# Patient Record
Sex: Female | Born: 1965 | Race: White | Hispanic: No | Marital: Married | Smoking: Former smoker
Health system: Southern US, Community
[De-identification: ages and names within clinical notes are randomized; demographics above are authoritative.]

## PROBLEM LIST (undated history)

## (undated) DIAGNOSIS — Z85828 Personal history of other malignant neoplasm of skin: Secondary | ICD-10-CM

## (undated) DIAGNOSIS — E279 Disorder of adrenal gland, unspecified: Secondary | ICD-10-CM

## (undated) DIAGNOSIS — E559 Vitamin D deficiency, unspecified: Secondary | ICD-10-CM

## (undated) DIAGNOSIS — E785 Hyperlipidemia, unspecified: Secondary | ICD-10-CM

## (undated) DIAGNOSIS — I2510 Atherosclerotic heart disease of native coronary artery without angina pectoris: Secondary | ICD-10-CM

## (undated) DIAGNOSIS — N840 Polyp of corpus uteri: Secondary | ICD-10-CM

## (undated) DIAGNOSIS — R7303 Prediabetes: Secondary | ICD-10-CM

## (undated) DIAGNOSIS — I10 Essential (primary) hypertension: Secondary | ICD-10-CM

## (undated) HISTORY — PX: MOHS SURGERY: SUR867

## (undated) HISTORY — PX: DILATION AND CURETTAGE OF UTERUS: SHX78

---

## 2006-04-21 ENCOUNTER — Ambulatory Visit: Payer: Self-pay

## 2011-01-02 ENCOUNTER — Ambulatory Visit: Payer: Self-pay | Admitting: Internal Medicine

## 2014-09-21 ENCOUNTER — Ambulatory Visit: Payer: Self-pay | Admitting: Internal Medicine

## 2015-09-10 ENCOUNTER — Inpatient Hospital Stay
Admission: EM | Admit: 2015-09-10 | Discharge: 2015-09-14 | DRG: 416 | Disposition: A | Discharge status: Home / Self Care | Payer: BLUE CROSS/BLUE SHIELD | Attending: Surgery | Admitting: Surgery

## 2015-09-10 ENCOUNTER — Emergency Department: Payer: BLUE CROSS/BLUE SHIELD

## 2015-09-10 ENCOUNTER — Encounter: Payer: Self-pay | Admitting: Emergency Medicine

## 2015-09-10 DIAGNOSIS — K810 Acute cholecystitis: Secondary | ICD-10-CM | POA: Diagnosis not present

## 2015-09-10 DIAGNOSIS — Z6829 Body mass index (BMI) 29.0-29.9, adult: Secondary | ICD-10-CM

## 2015-09-10 DIAGNOSIS — K8020 Calculus of gallbladder without cholecystitis without obstruction: Secondary | ICD-10-CM

## 2015-09-10 DIAGNOSIS — E669 Obesity, unspecified: Secondary | ICD-10-CM | POA: Diagnosis present

## 2015-09-10 DIAGNOSIS — K8050 Calculus of bile duct without cholangitis or cholecystitis without obstruction: Secondary | ICD-10-CM | POA: Diagnosis not present

## 2015-09-10 DIAGNOSIS — I10 Essential (primary) hypertension: Secondary | ICD-10-CM | POA: Diagnosis present

## 2015-09-10 DIAGNOSIS — K8000 Calculus of gallbladder with acute cholecystitis without obstruction: Principal | ICD-10-CM | POA: Diagnosis present

## 2015-09-10 HISTORY — DX: Essential (primary) hypertension: I10

## 2015-09-10 LAB — CBC WITH DIFFERENTIAL/PLATELET
Basophils Absolute: 0.1 10*3/uL (ref 0–0.1)
Basophils Relative: 0 %
EOS PCT: 1 %
Eosinophils Absolute: 0.2 10*3/uL (ref 0–0.7)
HEMATOCRIT: 39.6 % (ref 35.0–47.0)
Hemoglobin: 13.6 g/dL (ref 12.0–16.0)
LYMPHS ABS: 1.5 10*3/uL (ref 1.0–3.6)
LYMPHS PCT: 11 %
MCH: 32.7 pg (ref 26.0–34.0)
MCHC: 34.4 g/dL (ref 32.0–36.0)
MCV: 94.9 fL (ref 80.0–100.0)
MONO ABS: 0.7 10*3/uL (ref 0.2–0.9)
Monocytes Relative: 5 %
NEUTROS ABS: 11.3 10*3/uL — AB (ref 1.4–6.5)
Neutrophils Relative %: 83 %
PLATELETS: 270 10*3/uL (ref 150–440)
RBC: 4.18 MIL/uL (ref 3.80–5.20)
RDW: 13.1 % (ref 11.5–14.5)
WBC: 13.9 10*3/uL — ABNORMAL HIGH (ref 3.6–11.0)

## 2015-09-10 LAB — URINALYSIS COMPLETE WITH MICROSCOPIC (ARMC ONLY)
BILIRUBIN URINE: NEGATIVE
GLUCOSE, UA: NEGATIVE mg/dL
Hgb urine dipstick: NEGATIVE
KETONES UR: NEGATIVE mg/dL
Nitrite: NEGATIVE
Protein, ur: NEGATIVE mg/dL
Specific Gravity, Urine: 1.025 (ref 1.005–1.030)
pH: 5 (ref 5.0–8.0)

## 2015-09-10 LAB — COMPREHENSIVE METABOLIC PANEL
ALT: 24 U/L (ref 14–54)
AST: 19 U/L (ref 15–41)
Albumin: 3.8 g/dL (ref 3.5–5.0)
Alkaline Phosphatase: 64 U/L (ref 38–126)
Anion gap: 5 (ref 5–15)
BILIRUBIN TOTAL: 0.8 mg/dL (ref 0.3–1.2)
BUN: 19 mg/dL (ref 6–20)
CHLORIDE: 106 mmol/L (ref 101–111)
CO2: 30 mmol/L (ref 22–32)
CREATININE: 0.73 mg/dL (ref 0.44–1.00)
Calcium: 9.2 mg/dL (ref 8.9–10.3)
Glucose, Bld: 135 mg/dL — ABNORMAL HIGH (ref 65–99)
Potassium: 3.3 mmol/L — ABNORMAL LOW (ref 3.5–5.1)
Sodium: 141 mmol/L (ref 135–145)
TOTAL PROTEIN: 7 g/dL (ref 6.5–8.1)

## 2015-09-10 LAB — LIPASE, BLOOD: LIPASE: 25 U/L (ref 22–51)

## 2015-09-10 MED ORDER — IOHEXOL 240 MG/ML SOLN
25.0000 mL | Freq: Once | INTRAMUSCULAR | Status: AC | PRN
  Administered 2015-09-10: 25 mL via ORAL
  Filled 2015-09-10: qty 25

## 2015-09-10 MED ORDER — KCL IN DEXTROSE-NACL 20-5-0.45 MEQ/L-%-% IV SOLN
INTRAVENOUS | Status: DC
  Administered 2015-09-10 – 2015-09-13 (×6): via INTRAVENOUS
  Filled 2015-09-10 (×11): qty 1000

## 2015-09-10 MED ORDER — HYDROMORPHONE HCL 1 MG/ML IJ SOLN
1.0000 mg | INTRAMUSCULAR | Status: DC | PRN
  Administered 2015-09-10 – 2015-09-11 (×4): 1 mg via INTRAVENOUS
  Filled 2015-09-10 (×4): qty 1

## 2015-09-10 MED ORDER — MORPHINE SULFATE (PF) 4 MG/ML IV SOLN
4.0000 mg | Freq: Once | INTRAVENOUS | Status: AC
  Administered 2015-09-10: 4 mg via INTRAVENOUS
  Filled 2015-09-10: qty 1

## 2015-09-10 MED ORDER — HYDROMORPHONE HCL 1 MG/ML IJ SOLN
1.0000 mg | Freq: Once | INTRAMUSCULAR | Status: AC
Start: 2015-09-10 — End: 2015-09-10
  Administered 2015-09-10: 1 mg via INTRAVENOUS
  Filled 2015-09-10: qty 1

## 2015-09-10 MED ORDER — HYDROMORPHONE HCL 1 MG/ML IJ SOLN
INTRAMUSCULAR | Status: AC
  Administered 2015-09-10: 1 mg via INTRAVENOUS
  Filled 2015-09-10: qty 1

## 2015-09-10 MED ORDER — ACETAMINOPHEN 650 MG RE SUPP: 650.0000 mg | Freq: Four times a day (QID) | RECTAL | Status: DC | PRN

## 2015-09-10 MED ORDER — ACETAMINOPHEN 325 MG PO TABS
650.0000 mg | ORAL_TABLET | Freq: Four times a day (QID) | ORAL | Status: DC | PRN
  Administered 2015-09-11: 650 mg via ORAL
  Filled 2015-09-10: qty 2

## 2015-09-10 MED ORDER — ONDANSETRON HCL 4 MG/2ML IJ SOLN
4.0000 mg | Freq: Four times a day (QID) | INTRAMUSCULAR | Status: DC | PRN
  Administered 2015-09-11: 4 mg via INTRAVENOUS

## 2015-09-10 MED ORDER — ONDANSETRON HCL 4 MG PO TABS: 4.0000 mg | ORAL_TABLET | Freq: Four times a day (QID) | ORAL | Status: DC | PRN

## 2015-09-10 MED ORDER — AMPICILLIN-SULBACTAM SODIUM 3 (2-1) G IJ SOLR
3.0000 g | Freq: Four times a day (QID) | INTRAMUSCULAR | Status: DC
  Administered 2015-09-10 – 2015-09-13 (×11): 3 g via INTRAVENOUS
  Filled 2015-09-10 (×13): qty 3

## 2015-09-10 MED ORDER — HYDROMORPHONE HCL 1 MG/ML IJ SOLN
INTRAMUSCULAR | Status: AC
  Filled 2015-09-10: qty 1

## 2015-09-10 MED ORDER — HYDROMORPHONE HCL 1 MG/ML IJ SOLN
1.0000 mg | Freq: Once | INTRAMUSCULAR | Status: AC
  Administered 2015-09-10: 1 mg via INTRAVENOUS

## 2015-09-10 MED ORDER — ENOXAPARIN SODIUM 40 MG/0.4ML ~~LOC~~ SOLN
40.0000 mg | SUBCUTANEOUS | Status: DC
  Administered 2015-09-10 – 2015-09-13 (×4): 40 mg via SUBCUTANEOUS
  Filled 2015-09-10 (×4): qty 0.4

## 2015-09-10 MED ORDER — IOHEXOL 300 MG/ML  SOLN
100.0000 mL | Freq: Once | INTRAMUSCULAR | Status: AC | PRN
  Administered 2015-09-10: 100 mL via INTRAVENOUS
  Filled 2015-09-10: qty 100

## 2015-09-10 NOTE — ED Provider Notes (Signed)
[facility] Emergency Department Provider Note  Time seen: 2:29 PM  I have reviewed the triage vital signs and the nursing notes.   HISTORY  Chief Complaint Abdominal Pain    HPI Julia Hartman is a 49 y.o. female with a past medical history of hypertension who presents with upper abdominal pain for the past 3 days. According to the patient she has had progressively worsening upper abdominal pain. Now with nausea and one episode of vomiting. States it is an 8 out of 10 currently. Denies diarrhea, dysuria, hematuria. Denies any worsening with food but notes she has not had an appetite due to pain. Denies any history of abdominal surgeries. Denies any fever. States the pain is a dull/aching pain, unrelenting.     Past Medical History  Diagnosis Date  . Hypertension     There are no active problems to display for this patient.   History reviewed. No pertinent past surgical history.  No current outpatient prescriptions on file.  Allergies Review of patient's allergies indicates no known allergies.  History reviewed. No pertinent family history.  Social History Social History  Substance Use Topics  . Smoking status: Never Smoker   . Smokeless tobacco: None  . Alcohol Use: Yes    Review of Systems Constitutional: Negative for fever. Cardiovascular: Negative for chest pain. Respiratory: Negative for shortness of breath. Gastrointestinal:  positive for upper abdominal pain, nausea, one episode of vomiting. No diarrhea.: Negative for dysuria. Musculoskeletal: Negative for back pain. Neurological: Negative for headache 10-point ROS otherwise negative.  ____________________________________________   PHYSICAL EXAM:  VITAL SIGNS: ED Triage Vitals  Enc Vitals Group     BP 09/10/15 1313 141/80 mmHg     Pulse Rate 09/10/15 1313 67     Resp 09/10/15 1404 18     Temp 09/10/15 1313 98.7 F (37.1 C)     Temp Source 09/10/15 1313 Oral     SpO2  09/10/15 1313 98 %     Weight 09/10/15 1313 175 lb (79.379 kg)     Height 09/10/15 1313  (1.651 m)     Head Cir --      Peak Flow --      Pain Score 09/10/15 1314 8     Pain Loc --      Pain Edu? --      Excl. in GC? --     Constitutional: Alert and oriented.  mild distress due to pain.  Eyes: Normal exam ENT   Head: Normocephalic and atraumatic.   Mouth/Throat: Mucous membranes are moist. Cardiovascular: Normal rate, regular rhythm. No murmur Respiratory: Normal respiratory effort without tachypnea nor retractions. Breath sounds are clear and equal bilaterally. No wheezes/rales/rhonchi. Gastrointestinalsoft, moderate upper abdominal tenderness to palpation, appears to be more midline. No rebound or guarding. No CVA tenderness to palpation. No distention noted. Musculoskeletal: Nontender with normal range of motion in all extremities.  Neurologic:  Normal speech and language. No gross focal neurologic deficits Psychiatric: Mood and affect are normal. Speech and behavior are normal. Patient exhibits appropriate insight and judgment.  ____________________________________________    EKG  EKG reviewed and interpreted by myself shows normal sinus rhythm at 63 bpm, narrow QRS, normal axis, normal intervals, no ST changes noted. Overall normal EKG.  ____________________________________________    RADIOLOGY  CT shows multiple gallstones with possible wall thickening Ultrasound shows multiple gallstones but no signs of acute cholecystitis.  ____________________________________________   INITIAL IMPRESSION / ASSESSMENT AND PLAN / ED COURSE  Pertinent labs & imaging results that were available during my care of the patient were reviewed by me and considered in my medical decision making (see chart for details).  Patient has a mild leukocytosis, labs otherwise within normal limits. Epigastric tenderness palpation on exam. Mild distress.  we will proceed with a CT abdomen  and pelvis to further evaluate. Patient denies any alcohol use. She does note almost daily NSAID use. Differential would include gastritis, but we will proceed with a CT to rule out more concerning pathologies.   Given the location of her pain in the right upper quadrant/epigastrium, she has required multiple rounds of pain medication, elevated white blood cell count, and ultrasound findings I have consult to general surgery for evaluation.  Dr. Egbert Garibaldi seen the patient and will be admitted to his service.   ____________________________________________   FINAL CLINICAL IMPRESSION(S) / ED DIAGNOSES  epigastric pain  Minna Antis, MD 09/10/15 [id number]

## 2015-09-10 NOTE — H&P (Signed)
Julia Hartman is an 49 y.o. female.     Chief Complaint: upper abdominal pain and emesis HPI:  49 year old female with a family history of cholelithasis who presents to the emergency room with a 2 day history of colicky severe epigastric abdominal pain without radiation into her right upper quadrant or mid back. This began after a meal chili. The patient denies any previous history of fatty food intolerance or postprandial right upper quadrant abdominal pain. Of note her brother recently had his gallbladder out as well as her mother. The patient denies any fevers but has had some chills. There is been no reported jaundice. She drinks wine occasionally. Workup in the emergency room in suggestive of "acute cholecystitis and possible choledocholithiasis. Surgical services were asked to evaluate. The patient had a previous D&C.  Past Medical History  Diagnosis Date  . Hypertension     Surgical history significant for D&C  Family history significant for cholelithiasis.   Social History:  reports that she has never smoked. She does not have any smokeless tobacco history on file. She reports that she drinks alcohol. Her drug history is not on file.  she drinks a glass of wine occasionally. She is self-employed as a Investment banker, operational.  Allergies: No Known Allergies  Review of Systems  Constitutional: Positive for chills, weight loss, malaise/fatigue and diaphoresis. Negative for fever.  HENT: Negative.   Eyes: Negative.   Respiratory: Negative for cough.   Cardiovascular: Negative for chest pain and palpitations.  Gastrointestinal: Positive for nausea, vomiting and abdominal pain. Negative for diarrhea, constipation, blood in stool and melena.  Genitourinary: Negative for dysuria and urgency.  Musculoskeletal: Negative.   Skin: Negative.   Neurological: Positive for weakness.  Endo/Heme/Allergies: Negative.      Physical Exam  Constitutional: She is oriented to person,  place, and time and well-developed, well-nourished, and in no distress. No distress.  HENT:  Head: Normocephalic and atraumatic.  Eyes: Pupils are equal, round, and reactive to light.  Neck: Normal range of motion. No tracheal deviation present. No thyromegaly present.  Cardiovascular: Normal rate, regular rhythm and normal heart sounds.  Exam reveals no gallop.   No murmur heard. Pulmonary/Chest: Breath sounds normal. No stridor. No respiratory distress. She has no wheezes.  Abdominal: Soft. Bowel sounds are normal. She exhibits no distension and no mass. There is no hepatomegaly. There is tenderness in the epigastric area. There is no rebound and no guarding. No hernia.    Neurological: She is oriented to person, place, and time.  Skin: Skin is dry. She is not diaphoretic.  Psychiatric: Mood, memory, affect and judgment normal.    Blood pressure 180/88, pulse 64, temperature 98.7 F (37.1 C), temperature source Oral, resp. rate 18, height _0  (1.651 m), weight 175 lb (79.379 kg), SpO2 100 %.  Results for orders placed or performed during the hospital encounter of 09/10/15 (from the past 48 hour(s))  CBC WITH DIFFERENTIAL     Status: Abnormal   Collection Time: 09/10/15  1:26 PM  Result Value Ref Range   WBC 13.9 (H) 3.6 - 11.0 K/uL   RBC 4.18 3.80 - 5.20 MIL/uL   Hemoglobin 13.6 12.0 - 16.0 g/dL   HCT 39.6 35.0 - 47.0 %   MCV 94.9 80.0 - 100.0 fL   MCH 32.7 26.0 - 34.0 pg   MCHC 34.4 32.0 - 36.0 g/dL   RDW 13.1 11.5 - 14.5 %   Platelets 270 150 - 440 K/uL  Neutrophils Relative % 83 %   Neutro Abs 11.3 (H) 1.4 - 6.5 K/uL   Lymphocytes Relative 11 %   Lymphs Abs 1.5 1.0 - 3.6 K/uL   Monocytes Relative 5 %   Monocytes Absolute 0.7 0.2 - 0.9 K/uL   Eosinophils Relative 1 %   Eosinophils Absolute 0.2 0 - 0.7 K/uL   Basophils Relative 0 %   Basophils Absolute 0.1 0 - 0.1 K/uL  Comprehensive metabolic panel     Status: Abnormal   Collection Time: 09/10/15  1:26 PM  Result  Value Ref Range   Sodium 141 135 - 145 mmol/L   Potassium 3.3 (L) 3.5 - 5.1 mmol/L   Chloride 106 101 - 111 mmol/L   CO2 30 22 - 32 mmol/L   Glucose, Bld 135 (H) 65 - 99 mg/dL   BUN 19 6 - 20 mg/dL   Creatinine, Ser 0.73 0.44 - 1.00 mg/dL   Calcium 9.2 8.9 - 10.3 mg/dL   Total Protein 7.0 6.5 - 8.1 g/dL   Albumin 3.8 3.5 - 5.0 g/dL   AST 19 15 - 41 U/L   ALT 24 14 - 54 U/L   Alkaline Phosphatase 64 38 - 126 U/L   Total Bilirubin 0.8 0.3 - 1.2 mg/dL   GFR calc non Af Amer >60 >60 mL/min   GFR calc Af Amer >60 >60 mL/min    Comment: (NOTE) The eGFR has been calculated using the CKD EPI equation. This calculation has not been validated in all clinical situations. eGFR's persistently <60 mL/min signify possible Chronic Kidney Disease.    Anion gap 5 5 - 15  Lipase, blood     Status: None   Collection Time: 09/10/15  1:26 PM  Result Value Ref Range   Lipase 25 22 - 51 U/L  Urinalysis complete, with microscopic ([facility] only)     Status: Abnormal   Collection Time: 09/10/15  1:40 PM  Result Value Ref Range   Color, Urine YELLOW (A) YELLOW   APPearance CLEAR (A) CLEAR   Glucose, UA NEGATIVE NEGATIVE mg/dL   Bilirubin Urine NEGATIVE NEGATIVE   Ketones, ur NEGATIVE NEGATIVE mg/dL   Specific Gravity, Urine 1.025 1.005 - 1.030   Hgb urine dipstick NEGATIVE NEGATIVE   pH 5.0 5.0 - 8.0   Protein, ur NEGATIVE NEGATIVE mg/dL   Nitrite NEGATIVE NEGATIVE   Leukocytes, UA TRACE (A) NEGATIVE   RBC / HPF 0-5 0 - 5 RBC/hpf   WBC, UA 0-5 0 - 5 WBC/hpf   Bacteria, UA RARE (A) NONE SEEN   Squamous Epithelial / LPF 6-30 (A) NONE SEEN   Mucous PRESENT    Ct Abdomen Pelvis W Contrast  09/10/2015   CLINICAL DATA:  Epigastric pain, vomiting starting Saturday night  EXAM: CT ABDOMEN AND PELVIS WITH CONTRAST  TECHNIQUE: Multidetector CT imaging of the abdomen and pelvis was performed using the standard protocol following bolus administration of intravenous contrast.  CONTRAST:  172m OMNIPAQUE  IOHEXOL 300 MG/ML  SOLN  COMPARISON:  None.  FINDINGS: Lung bases are unremarkable. Enhanced liver shows no focal mass. Sagittal images of the spine shows mild degenerative changes thoracolumbar spine. Mild dextroscoliosis lumbar spine. Mild distended gallbladder. Small calcified gallstones are noted in gallbladder fundus the largest measures 5 mm. There is mild thickening of the gallbladder wall in proximal gallbladder. This is best visualized in coronal image 72. Findings are highly suspicious for acute cholecystitis. Clinical correlation is necessary. CBD measures 8 mm in diameter. Main pancreatic duct  measures 3 mm in diameter. No pancreatic mass. No evidence of acute pancreatitis. The spleen and adrenal glands are unremarkable. Kidneys are symmetrical in size and enhancement. No hydronephrosis or hydroureter.  No small bowel obstruction. No ascites or free air. No adenopathy. Normal appendix. No pericecal inflammation. The terminal ileum is unremarkable. The uterus is retroflexed. The ovaries are unremarkable. Small amount of pelvic free fluid posterior pelvis. No inguinal adenopathy. Mild degenerative changes pubic symphysis. No destructive bony lesions are noted within pelvis. Mild degenerative changes SI joints.  There is no evidence of gastric outlet obstruction.  IMPRESSION: 1. Small gallstones are noted within gallbladder. Mild thickening of gallbladder wall and proximal gallbladder please see 72. Cholecystitis cannot be excluded. Further evaluation some with gallbladder ultrasound and clinical correlation is recommended. 2. Borderline prominent main pancreatic duct without evidence of acute pancreatitis or pancreatic mass. 3. No hydronephrosis or hydroureter. 4. Normal appendix.  No pericecal inflammation. 5. Retroflexed uterus.  Small amount of free fluid within pelvis. These results were called by telephone at the time of interpretation on 09/10/2015 at 3:33 pm to Dr. Harvest Dark , who verbally  acknowledged these results.   Electronically Signed   By: Lahoma Crocker M.D.   On: 09/10/2015 15:33   US Abdomen Limited Ruq  09/10/2015   CLINICAL DATA:  Right upper quadrant pain for 2 days.  EXAM: US ABDOMEN LIMITED - RIGHT UPPER QUADRANT  COMPARISON:  Current abdomen and pelvis CT  FINDINGS: Gallbladder:  Multiple dependent gallstones. There is no gallbladder wall thickening or pericholecystic fluid.  Common bile duct:  Diameter: 5.7 mm proximally and 7 mm in its midportion. No sonographic evidence of a duct stone. No evidence of a duct stone  Liver:  No focal lesion identified. Within normal limits in parenchymal echogenicity.  IMPRESSION: 1. Multiple gallstones.  No evidence of acute cholecystitis. 2. Mild dilation of the common bile duct without ultrasound evidence of a duct stone. A distal duct stone, however, is not excluded, since this portion of the duct was not fully visualized.   Electronically Signed   By: Lajean Manes M.D.   On: 09/10/2015 17:07     Assessment/Plan   This is a 49 year old white female with what looks like acute cholecystitis with leukocytosis. Central location of her pain in the epigastrium as well as a dilated bile duct is also suggestive of possible choledocholithiasis. I will admit her to the hospital hydrated her and provide dilated IV pain medication as well as Zofran.    I will repeat her liver function tests in the morning. In the side and whether or not MRCP will be done preoperatively. I discussed with her and her mother-in-law present laparoscopic cholecystectomy including cholangiography the 1 and 200 risk of bile duct injury and the need for an open operation and the possibility of needing an postoperative ERCP based on the intraoperative findings. All of her questions were answered and she wishes to proceed with this course of action.  Hortencia Conradi, MD, FACS

## 2015-09-10 NOTE — ED Notes (Signed)
Pt up to the BR with 1 assist.the patient states pain has improved. Skin is warm and dry, respirations WNL.Marland Kitchen

## 2015-09-10 NOTE — ED Notes (Signed)
Pt finished drinking contrast, CT notified 

## 2015-09-10 NOTE — ED Notes (Signed)
Pt with epigastric pain that started sat nite with emesis and grew worse. 8/10 now. Pt tearful. Denies diarrhea.

## 2015-09-11 ENCOUNTER — Observation Stay: Payer: BLUE CROSS/BLUE SHIELD | Admitting: Anesthesiology

## 2015-09-11 ENCOUNTER — Observation Stay: Payer: BLUE CROSS/BLUE SHIELD

## 2015-09-11 ENCOUNTER — Encounter
Admission: EM | Disposition: A | Discharge status: Home / Self Care | Payer: Self-pay | Source: Home / Self Care | Attending: Surgery

## 2015-09-11 ENCOUNTER — Encounter: Payer: Self-pay | Admitting: Anesthesiology

## 2015-09-11 DIAGNOSIS — K8020 Calculus of gallbladder without cholecystitis without obstruction: Secondary | ICD-10-CM | POA: Insufficient documentation

## 2015-09-11 DIAGNOSIS — K810 Acute cholecystitis: Secondary | ICD-10-CM

## 2015-09-11 DIAGNOSIS — K8050 Calculus of bile duct without cholangitis or cholecystitis without obstruction: Secondary | ICD-10-CM | POA: Diagnosis not present

## 2015-09-11 HISTORY — PX: CHOLECYSTECTOMY: SHX55

## 2015-09-11 LAB — COMPREHENSIVE METABOLIC PANEL
ALT: 35 U/L (ref 14–54)
ANION GAP: 10 (ref 5–15)
AST: 24 U/L (ref 15–41)
Albumin: 3.2 g/dL — ABNORMAL LOW (ref 3.5–5.0)
Alkaline Phosphatase: 56 U/L (ref 38–126)
BUN: 15 mg/dL (ref 6–20)
CHLORIDE: 101 mmol/L (ref 101–111)
CO2: 27 mmol/L (ref 22–32)
CREATININE: 0.66 mg/dL (ref 0.44–1.00)
Calcium: 8.3 mg/dL — ABNORMAL LOW (ref 8.9–10.3)
Glucose, Bld: 144 mg/dL — ABNORMAL HIGH (ref 65–99)
POTASSIUM: 3.3 mmol/L — AB (ref 3.5–5.1)
SODIUM: 138 mmol/L (ref 135–145)
Total Bilirubin: 0.9 mg/dL (ref 0.3–1.2)
Total Protein: 5.9 g/dL — ABNORMAL LOW (ref 6.5–8.1)

## 2015-09-11 LAB — SURGICAL PCR SCREEN
MRSA, PCR: NEGATIVE
STAPHYLOCOCCUS AUREUS: NEGATIVE

## 2015-09-11 SURGERY — LAPAROSCOPIC CHOLECYSTECTOMY
Anesthesia: General | Wound class: Clean Contaminated

## 2015-09-11 MED ORDER — SUGAMMADEX SODIUM 500 MG/5ML IV SOLN
INTRAVENOUS | Status: DC | PRN
  Administered 2015-09-11: 161.8 mg via INTRAVENOUS

## 2015-09-11 MED ORDER — MIDAZOLAM HCL 5 MG/5ML IJ SOLN
INTRAMUSCULAR | Status: DC | PRN
  Administered 2015-09-11: 2 mg via INTRAVENOUS

## 2015-09-11 MED ORDER — HYDROMORPHONE 0.3 MG/ML IV SOLN
INTRAVENOUS | Status: DC
  Administered 2015-09-11: 17:00:00 via INTRAVENOUS
  Administered 2015-09-11: 1.1 mg via INTRAVENOUS
  Administered 2015-09-12: 0.3 mg via INTRAVENOUS
  Administered 2015-09-12: 1.5 mg via INTRAVENOUS
  Administered 2015-09-12: 0.9 mg via INTRAVENOUS
  Administered 2015-09-12: 1.2 mg via INTRAVENOUS
  Administered 2015-09-12: 1.5 mg via INTRAVENOUS
  Administered 2015-09-13: 0.3 mg via INTRAVENOUS
  Administered 2015-09-13: 0.6 mg via INTRAVENOUS
  Administered 2015-09-13: 0.9 mg via INTRAVENOUS
  Filled 2015-09-11 (×2): qty 25

## 2015-09-11 MED ORDER — LIDOCAINE HCL (CARDIAC) 20 MG/ML IV SOLN
INTRAVENOUS | Status: DC | PRN
  Administered 2015-09-11: 80 mg via INTRAVENOUS

## 2015-09-11 MED ORDER — GLYCOPYRROLATE 0.2 MG/ML IJ SOLN
INTRAMUSCULAR | Status: DC | PRN
  Administered 2015-09-11: 0.2 mg via INTRAVENOUS

## 2015-09-11 MED ORDER — DEXAMETHASONE SODIUM PHOSPHATE 10 MG/ML IJ SOLN
INTRAMUSCULAR | Status: DC | PRN
  Administered 2015-09-11: 10 mg via INTRAVENOUS

## 2015-09-11 MED ORDER — SUCCINYLCHOLINE CHLORIDE 20 MG/ML IJ SOLN
INTRAMUSCULAR | Status: DC | PRN
  Administered 2015-09-11: 100 mg via INTRAVENOUS

## 2015-09-11 MED ORDER — BUPIVACAINE HCL 0.25 % IJ SOLN
INTRAMUSCULAR | Status: DC | PRN
  Administered 2015-09-11: 30 mL

## 2015-09-11 MED ORDER — KETOROLAC TROMETHAMINE 30 MG/ML IJ SOLN
INTRAMUSCULAR | Status: DC | PRN
  Administered 2015-09-11: 30 mg via INTRAVENOUS

## 2015-09-11 MED ORDER — OXYCODONE HCL 5 MG/5ML PO SOLN: 5.0000 mg | Freq: Once | ORAL | Status: AC | PRN

## 2015-09-11 MED ORDER — DIPHENHYDRAMINE HCL 12.5 MG/5ML PO ELIX: 12.5000 mg | ORAL_SOLUTION | Freq: Four times a day (QID) | ORAL | Status: DC | PRN

## 2015-09-11 MED ORDER — OXYCODONE HCL 5 MG PO TABS
ORAL_TABLET | ORAL | Status: AC
  Administered 2015-09-11: 5 mg via ORAL
  Filled 2015-09-11: qty 1

## 2015-09-11 MED ORDER — ROCURONIUM BROMIDE 100 MG/10ML IV SOLN
INTRAVENOUS | Status: DC | PRN
  Administered 2015-09-11: 10 mg via INTRAVENOUS
  Administered 2015-09-11: 40 mg via INTRAVENOUS
  Administered 2015-09-11: 20 mg via INTRAVENOUS
  Administered 2015-09-11: 10 mg via INTRAVENOUS

## 2015-09-11 MED ORDER — DIPHENHYDRAMINE HCL 50 MG/ML IJ SOLN: 12.5000 mg | Freq: Four times a day (QID) | INTRAMUSCULAR | Status: DC | PRN

## 2015-09-11 MED ORDER — OXYCODONE HCL 5 MG PO TABS
5.0000 mg | ORAL_TABLET | Freq: Once | ORAL | Status: AC | PRN
  Administered 2015-09-11: 5 mg via ORAL

## 2015-09-11 MED ORDER — THROMBIN 5000 UNITS EX SOLR
CUTANEOUS | Status: AC
  Filled 2015-09-11: qty 5000

## 2015-09-11 MED ORDER — THROMBIN 5000 UNITS EX SOLR
CUTANEOUS | Status: DC | PRN
  Administered 2015-09-11: 5000 [IU] via TOPICAL

## 2015-09-11 MED ORDER — LACTATED RINGERS IV SOLN
INTRAVENOUS | Status: DC | PRN
  Administered 2015-09-11 (×2): via INTRAVENOUS

## 2015-09-11 MED ORDER — FENTANYL CITRATE (PF) 250 MCG/5ML IJ SOLN
INTRAMUSCULAR | Status: DC | PRN
  Administered 2015-09-11 (×2): 100 ug via INTRAVENOUS
  Administered 2015-09-11: 50 ug via INTRAVENOUS

## 2015-09-11 MED ORDER — NALOXONE HCL 0.4 MG/ML IJ SOLN: 0.4000 mg | INTRAMUSCULAR | Status: DC | PRN

## 2015-09-11 MED ORDER — ACETAMINOPHEN 10 MG/ML IV SOLN
INTRAVENOUS | Status: DC | PRN
  Administered 2015-09-11: 1000 mg via INTRAVENOUS

## 2015-09-11 MED ORDER — FENTANYL CITRATE (PF) 100 MCG/2ML IJ SOLN
INTRAMUSCULAR | Status: AC
  Administered 2015-09-11: 50 ug via INTRAVENOUS
  Filled 2015-09-11: qty 2

## 2015-09-11 MED ORDER — SODIUM CHLORIDE 0.9 % IJ SOLN: 9.0000 mL | INTRAMUSCULAR | Status: DC | PRN

## 2015-09-11 MED ORDER — PROPOFOL 10 MG/ML IV BOLUS
INTRAVENOUS | Status: DC | PRN
  Administered 2015-09-11: 150 mg via INTRAVENOUS

## 2015-09-11 MED ORDER — ONDANSETRON HCL 4 MG/2ML IJ SOLN: 4.0000 mg | Freq: Four times a day (QID) | INTRAMUSCULAR | Status: DC | PRN

## 2015-09-11 MED ORDER — 0.9 % SODIUM CHLORIDE (POUR BTL) OPTIME
TOPICAL | Status: DC | PRN
  Administered 2015-09-11: 2500 mL

## 2015-09-11 MED ORDER — FENTANYL CITRATE (PF) 100 MCG/2ML IJ SOLN
25.0000 ug | INTRAMUSCULAR | Status: DC | PRN
Start: 2015-09-11 — End: 2015-09-13
  Administered 2015-09-11 (×3): 50 ug via INTRAVENOUS

## 2015-09-11 MED ORDER — BUPIVACAINE HCL (PF) 0.25 % IJ SOLN
INTRAMUSCULAR | Status: AC
  Filled 2015-09-11: qty 30

## 2015-09-11 MED ORDER — ACETAMINOPHEN 10 MG/ML IV SOLN
INTRAVENOUS | Status: AC
  Filled 2015-09-11: qty 100

## 2015-09-11 SURGICAL SUPPLY — 64 items
APPLICATOR SURGIFLO ENDO (HEMOSTASIS) ×2 IMPLANT
APPLIER CLIP 5 13 M/L LIGAMAX5 (MISCELLANEOUS) ×2
BAG COUNTER SPONGE EZ (MISCELLANEOUS) IMPLANT
BENZOIN TINCTURE PRP APPL 2/3 (GAUZE/BANDAGES/DRESSINGS) ×2 IMPLANT
BULB RESERV EVAC DRAIN JP 100C (MISCELLANEOUS) ×2 IMPLANT
CANISTER SUCT 1200ML W/VALVE (MISCELLANEOUS) ×2 IMPLANT
CATH CHOLANG 76X19 KUMAR (CATHETERS) IMPLANT
CHLORAPREP W/TINT 26ML (MISCELLANEOUS) ×2 IMPLANT
CLEANER CAUTERY TIP 5X5 PAD (MISCELLANEOUS) ×1 IMPLANT
CLIP APPLIE 5 13 M/L LIGAMAX5 (MISCELLANEOUS) ×1 IMPLANT
DEFOGGER SCOPE WARMER CLEARIFY (MISCELLANEOUS) ×2 IMPLANT
DISSECTOR KITTNER STICK (MISCELLANEOUS) ×1 IMPLANT
DISSECTORS/KITTNER STICK (MISCELLANEOUS) ×2
DRAIN CHANNEL JP 19F (MISCELLANEOUS) ×2 IMPLANT
DRAPE C-ARM XRAY 36X54 (DRAPES) IMPLANT
DRAPE SHEET LG 3/4 BI-LAMINATE (DRAPES) IMPLANT
DRAPE UTILITY 15X26 TOWEL STRL (DRAPES) ×4 IMPLANT
DRSG OPSITE POSTOP 3X4 (GAUZE/BANDAGES/DRESSINGS) ×2 IMPLANT
DRSG OPSITE POSTOP 4X10 (GAUZE/BANDAGES/DRESSINGS) ×2 IMPLANT
DRSG TEGADERM 2-3/8X2-3/4 SM (GAUZE/BANDAGES/DRESSINGS) ×2 IMPLANT
DRSG TELFA 3X8 NADH (GAUZE/BANDAGES/DRESSINGS) ×2 IMPLANT
ELECT BLADE 6.5 EXT (BLADE) ×2 IMPLANT
ENDOPOUCH RETRIEVER 10 (MISCELLANEOUS) ×2 IMPLANT
GLOVE BIO SURGEON STRL SZ7.5 (GLOVE) ×12 IMPLANT
GOWN STRL REUS W/ TWL LRG LVL3 (GOWN DISPOSABLE) ×2 IMPLANT
GOWN STRL REUS W/ TWL XL LVL3 (GOWN DISPOSABLE) ×3 IMPLANT
GOWN STRL REUS W/TWL LRG LVL3 (GOWN DISPOSABLE) ×2
GOWN STRL REUS W/TWL XL LVL3 (GOWN DISPOSABLE) ×3
HANDLE YANKAUER SUCT BULB TIP (MISCELLANEOUS) ×2 IMPLANT
HEMOSTAT SURGICEL 2X14 (HEMOSTASIS) ×2 IMPLANT
IRRIGATION STRYKERFLOW (MISCELLANEOUS) ×1 IMPLANT
IRRIGATOR STRYKERFLOW (MISCELLANEOUS) ×2
IV NS 1000ML (IV SOLUTION) ×1
IV NS 1000ML BAXH (IV SOLUTION) ×1 IMPLANT
LABEL OR SOLS (LABEL) ×2 IMPLANT
NEEDLE HYPO 25X1 1.5 SAFETY (NEEDLE) ×2 IMPLANT
NS IRRIG 500ML POUR BTL (IV SOLUTION) ×2 IMPLANT
PACK LAP CHOLECYSTECTOMY (MISCELLANEOUS) ×2 IMPLANT
PAD CLEANER CAUTERY TIP 5X5 (MISCELLANEOUS) ×1
PAD GROUND ADULT SPLIT (MISCELLANEOUS) ×2 IMPLANT
PENCIL ELECTRO HAND CTR (MISCELLANEOUS) ×2 IMPLANT
SCISSORS METZENBAUM CVD 33 (INSTRUMENTS) ×2 IMPLANT
SLEEVE ADV FIXATION 5X100MM (TROCAR) ×4 IMPLANT
SPOGE SURGIFLO 8M (HEMOSTASIS) ×1
SPONGE DRAIN TRACH 4X4 STRL 2S (GAUZE/BANDAGES/DRESSINGS) ×2 IMPLANT
SPONGE KITTNER 5P (MISCELLANEOUS) ×2 IMPLANT
SPONGE LAP 18X18 5 PK (GAUZE/BANDAGES/DRESSINGS) ×4 IMPLANT
SPONGE SURGIFLO 8M (HEMOSTASIS) ×1 IMPLANT
STAPLER SKIN PROX 35W (STAPLE) ×2 IMPLANT
STRAP SAFETY BODY (MISCELLANEOUS) IMPLANT
STRIP CLOSURE SKIN 1/2X4 (GAUZE/BANDAGES/DRESSINGS) ×2 IMPLANT
SUT ETHILON 3-0 FS-10 30 BLK (SUTURE) ×2
SUT SILK 3 0 SH 30 (SUTURE) ×2 IMPLANT
SUT VIC AB 0 CT1 36 (SUTURE) ×2 IMPLANT
SUT VIC AB 0 UR5 27 (SUTURE) ×4 IMPLANT
SUT VIC AB 1 CTX 27 (SUTURE) ×10 IMPLANT
SUT VIC AB 4-0 FS2 27 (SUTURE) ×2 IMPLANT
SUT VICRYL 0 TIES 12 18 (SUTURE) ×2 IMPLANT
SUTURE EHLN 3-0 FS-10 30 BLK (SUTURE) ×1 IMPLANT
SWABSTK COMLB BENZOIN TINCTURE (MISCELLANEOUS) ×2 IMPLANT
SYR 30ML LL (SYRINGE) ×2 IMPLANT
TROCAR XCEL BLUNT TIP 100MML (ENDOMECHANICALS) ×2 IMPLANT
TROCAR Z-THREAD OPTICAL 5X100M (TROCAR) ×4 IMPLANT
TUBING INSUFFLATOR HI FLOW (MISCELLANEOUS) ×2 IMPLANT

## 2015-09-11 NOTE — Op Note (Signed)
09/10/2015 - 09/11/2015  1:22 PM  PATIENT:  Julia Hartman  49 y.o. female  PRE-OPERATIVE DIAGNOSIS:  Acute calculus cholecystitis  POST-OPERATIVE DIAGNOSIS:  Acute cholecystitis  PROCEDURE:  Procedure(s): LAPAROSCOPIC CHOLECYSTECTOMY CONVERTED TO OPEN CHOLECYSTECTOMY (N/A)  SURGEON:  Surgeon(s) and Role:    * Natale Lay, MD - Primary  ASSISTANTS:  Scrub tech  ANESTHESIA: Gen. endotracheal     SPECIMEN: Gallbladder and contents    EBL: 250 cc  Description of procedure:    With informed consent supine position and general endotracheal anesthesia the patient's abdomen was widely prepped and draped ChloraPrep solution and a time out was observed. A 12 mm blunt Hassan trocar was placed through an open technique through an infra umbilical transversely oriented skin incision. Stay sutures passed. The peritoneum was established. The patient was in position reverse Trendelenburg and tilted right side up. A 5 mm bladed trocar was placed in the epigastrium to right sided abdominal trochars were placed. The gallbladder was adherent to the omentum in an acute fashion. This was teased down. 75 cc of hydropic fluid was aspirated. Gallbladder was grasped along its fundus and elevated towards the right lobe of the liver. Gallbladder was partially intrahepatic. There was a dense acute on chronic inflammatory change in the hepatoduodenal ligament which appeared to be markedly foreshortened. Dissection was area proved to be difficult. Dome down dissection was then undertaken. This was accomplished and the gallbladder essentially peeled off the gallbladder fossa. Large amount of venous bleeding was encountered and this was packed with a Ray-Tec sponge which was later removed. Despite multiple attempts at a critical view of safety cholecystectomy this was not accomplished. Open procedure was then undertaken.  Ports were then removed. A 9 cm Coker incision was fashion the right upper quadrant carried through  muscular fascial layers with electrocautery. Self-retaining abdominal wall retractor was placed. The gallbladder was then elevated with lateral traction. Dissection of the hepatoduodenal ligament this point demonstrated a single cystic artery going directly into the gallbladder which was single and nature. Was doubly clipped on the portal side singly clipped on the gallbladder side and divided. A large amount of stones were then manipulated out of the gallbladder neck into the gallbladder demonstrating a cystic duct with early necrotic changes. Three 10 mm clips were then placed across the base of the gallbladder across the cystic duct and the specimen was then submitted to pathology in formalin.  The area was clips irrigated. Hemostasis in the gallbladder fossa was achieved with Surgicel and thrombin spray and Surgi-Flo. 19 mm Blake drain was directed into the space exiting the lower most right upper quadrant port site. The abdomen was clips irrigated all stones were then retrieved from the operative field.  A two layered closure utilizing running #1 Vicryls from the extremes of the wound was applied. Total of 20 cc of 0.25% plain Marcaine was infiltrated along all the fascial incisions prior to closure. Skin edges reapproximated utilizing a skin stapler and the infraumbilical fascial defect was reapproximated with the existing stay sutures. Range site was secured with 4-0 nylon. A cone dressing and sterile tape were then applied. The patient was then transported to the recovery room in stable and satisfactory condition by anesthesia services.  Raynald Kemp, MD, FACS

## 2015-09-11 NOTE — Anesthesia Procedure Notes (Addendum)
Procedure Name: Intubation Date/Time: 09/11/2015 11:21 AM Performed by: Chong Sicilian Pre-anesthesia Checklist: Patient identified, Emergency Drugs available, Suction available, Patient being monitored and Timeout performed Patient Re-evaluated:Patient Re-evaluated prior to inductionOxygen Delivery Method: Circle system utilized Preoxygenation: Pre-oxygenation with 100% oxygen Intubation Type: IV induction and Rapid sequence Ventilation: Mask ventilation without difficulty Laryngoscope Size: 2 and Miller Grade View: Grade III Tube type: Oral Tube size: 7.0 mm Number of attempts: 1 Airway Equipment and Method: Stylet Placement Confirmation: ETT inserted through vocal cords under direct vision,  positive ETCO2 and breath sounds checked- equal and bilateral Secured at: 21 cm Tube secured with: Tape Dental Injury: Teeth and Oropharynx as per pre-operative assessment

## 2015-09-11 NOTE — Progress Notes (Addendum)
Patient ID: Julia Hartman, female   DOB: 24-Aug-1966, 49 y.o.   MRN: [id number]   Surgery  Pain a little better since admission   Still with significant discomfort in upper mid abdomen.  Filed Vitals:   09/10/15 1938 09/10/15 1956 09/11/15 0457 09/11/15 0700  BP: 131/72 128/78 107/55   Pulse: 65 69 77   Temp:  98.7 F (37.1 C) 98.7 F (37.1 C)   TempSrc:  Oral Oral   Resp: Height:      Weight:  178 lb 11.2 oz (81.058 kg)  178 lb 4.8 oz (80.876 kg)  SpO2: 95% 97% 92%     PE: The patient is alert and oriented 4. Lungs are clear. Heart regular rate and rhythm. Abdomen is soft however there is tenderness in the epigastrium and right upper quadrant deep palpation.  Review of Systems  Constitutional: Negative for fever and chills.  Respiratory: Negative for cough.   Gastrointestinal: Positive for nausea and abdominal pain.  Neurological: Negative for dizziness.  All other systems reviewed and are negative.  Labs  CBC Latest Ref Rng 09/10/2015  WBC 3.6 - 11.0 K/uL 13.9(H)  Hemoglobin 12.0 - 16.0 g/dL 40.9  Hematocrit 81.1 - 47.0 % 39.6  Platelets 150 - 440 K/uL 270   CMP Latest Ref Rng 09/11/2015 09/10/2015  Glucose 65 - 99 mg/dL 914(N) 829(F)  BUN 6 - 20 mg/dL 15 19  Creatinine 6.21 - 1.00 mg/dL 3.08 6.57  Sodium 846 - 145 mmol/L 138 141  Potassium 3.5 - 5.1 mmol/L 3.3(L) 3.3(L)  Chloride 101 - 111 mmol/L 101 106  CO2 22 - 32 mmol/L 27 30  Calcium 8.9 - 10.3 mg/dL 8.3(L) 9.2  Total Protein 6.5 - 8.1 g/dL 5.9(L) 7.0  Total Bilirubin 0.3 - 1.2 mg/dL 0.9 0.8  Alkaline Phos 38 - 126 U/L 56 64  AST 15 - 41 U/L 24 19  ALT 14 - 54 U/L 35 24     IMP acute cholecystitis possible choledocholithiasis  Plan  plan for laparoscopic cholecystectomy with intraoperative cholangiography this morning. She agrees with this plan and wishes to proceed. She admits that she has been postmenopausal since 38 and as such a urine pregnancy test is not  warranted.

## 2015-09-11 NOTE — Anesthesia Postprocedure Evaluation (Signed)
  Anesthesia Post-op Note  Patient: Julia Hartman  Procedure(s) Performed: Procedure(s): LAPAROSCOPIC CHOLECYSTECTOMY CONVERTED TO OPEN CHOLECYSTECTOMY (N/A)  Anesthesia type:General ETT  Patient location: PACU  Post pain: Pain level controlled  Post assessment: Post-op Vital signs reviewed, Patient's Cardiovascular Status Stable, Respiratory Function Stable, Patent Airway and No signs of Nausea or vomiting  Post vital signs: Reviewed and stable  Last Vitals:  Filed Vitals:   09/11/15 1637  BP:   Pulse:   Temp:   Resp: 16    Level of consciousness: awake, alert  and patient cooperative  Complications: No apparent anesthesia complications

## 2015-09-11 NOTE — Anesthesia Preprocedure Evaluation (Signed)
Anesthesia Evaluation  Patient identified by MRN, date of birth, ID band Patient awake    Reviewed: Allergy & Precautions, H&P , NPO status , Patient's Chart, lab work & pertinent test results  Airway Mallampati: II  TM Distance: >3 FB Neck ROM: full    Dental no notable dental hx. (+) Teeth Intact   Pulmonary neg pulmonary ROS, neg shortness of breath,    Pulmonary exam normal breath sounds clear to auscultation       Cardiovascular Exercise Tolerance: Good hypertension, (-) Past MI Normal cardiovascular exam Rhythm:regular Rate:Normal     Neuro/Psych negative neurological ROS  negative psych ROS   GI/Hepatic negative GI ROS, Neg liver ROS, neg GERD  ,  Endo/Other  negative endocrine ROS  Renal/GU negative Renal ROS  negative genitourinary   Musculoskeletal   Abdominal   Peds  Hematology negative hematology ROS (+)   Anesthesia Other Findings Past Medical History:   Hypertension     Obesity                                              Signs and symptoms suggestive of sleep apnea       Reproductive/Obstetrics negative OB ROS                             Anesthesia Physical Anesthesia Plan  ASA: III  Anesthesia Plan: General ETT   Post-op Pain Management:    Induction:   Airway Management Planned:   Additional Equipment:   Intra-op Plan:   Post-operative Plan:   Informed Consent: I have reviewed the patients History and Physical, chart, labs and discussed the procedure including the risks, benefits and alternatives for the proposed anesthesia with the patient or authorized representative who has indicated his/her understanding and acceptance.   Dental Advisory Given  Plan Discussed with: Anesthesiologist, CRNA and Surgeon  Anesthesia Plan Comments:         Anesthesia Quick Evaluation

## 2015-09-11 NOTE — Progress Notes (Signed)
Patient ID: Julia Hartman, female   DOB: [birth year], 49 y.o.   MRN: [id number]   POST OP  Discussed operative findings JP no bile Pain is major issue at this point  Filed Vitals:   09/11/15 1416 09/11/15 1440 09/11/15 1511 09/11/15 1637  BP: 126/73 123/74 131/75   Pulse: 71 72 65   Temp: 100.3 F (37.9 C) 98 F (36.7 C) 98.3 F (36.8 C)   TempSrc:  Oral Oral   Resp: Height:      Weight:      SpO2: 95% 95% 97% 98%    Stable post op course Will try some toradol in am.

## 2015-09-11 NOTE — Transfer of Care (Signed)
Immediate Anesthesia Transfer of Care Note  Patient: Julia Hartman  Procedure(s) Performed: Procedure(s): LAPAROSCOPIC CHOLECYSTECTOMY CONVERTED TO OPEN CHOLECYSTECTOMY (N/A)  Patient Location: PACU  Anesthesia Type:General  Level of Consciousness: awake, alert  and oriented  Airway & Oxygen Therapy: Patient Spontanous Breathing and Patient connected to face mask oxygen  Post-op Assessment: Report given to RN and Post -op Vital signs reviewed and stable  Post vital signs: Reviewed  Last Vitals:  Filed Vitals:   09/11/15 1331  BP: 134/83  Pulse: 86  Temp: 37.9 C  Resp: 18    Complications: No apparent anesthesia complications

## 2015-09-12 LAB — CBC
HCT: 32.3 % — ABNORMAL LOW (ref 35.0–47.0)
HEMOGLOBIN: 11.1 g/dL — AB (ref 12.0–16.0)
MCH: 32.8 pg (ref 26.0–34.0)
MCHC: 34.3 g/dL (ref 32.0–36.0)
MCV: 95.5 fL (ref 80.0–100.0)
Platelets: 214 10*3/uL (ref 150–440)
RBC: 3.38 MIL/uL — AB (ref 3.80–5.20)
RDW: 13.2 % (ref 11.5–14.5)
WBC: 15.1 10*3/uL — ABNORMAL HIGH (ref 3.6–11.0)

## 2015-09-12 LAB — SURGICAL PATHOLOGY

## 2015-09-12 MED ORDER — KETOROLAC TROMETHAMINE 30 MG/ML IJ SOLN
30.0000 mg | Freq: Three times a day (TID) | INTRAMUSCULAR | Status: AC
  Administered 2015-09-12 – 2015-09-13 (×3): 30 mg via INTRAVENOUS
  Filled 2015-09-12 (×3): qty 1

## 2015-09-12 NOTE — Progress Notes (Signed)
Patient ID: Julia GaskinsSheila G Hartman, female   DOB: 29-Apr-1966, 49 y.o.   MRN: [id number]   Surgery  POD 1  S/P open chole  Her main complaint is right upper quadrant abdominal pain. She wants full liquid diet. There is been no nausea no vomiting. Jackson-Pratt drain is nonbilious.  Filed Vitals:   09/12/15 0300 09/12/15 0421 09/12/15 0435 09/12/15 0748  BP:  118/71    Pulse:  58    Temp:  98.3 F (36.8 C)    TempSrc:  Oral    Resp:  18 21 16   Height:      Weight: 181 lb 1.6 oz (82.146 kg)     SpO2:  95% 95% 91%    PE: JP drainage has been 40 cc and noted as above. Her abdomen is soft and nontender her dressing is intact.  I/O last 3 completed shifts: In: 3793 [P.O.:180; I.V.:3263; Other:50; IV Piggyback:300] Out: 2190 [Urine:1900; Drains:40; Blood:250] Total I/O In: 363.3 [I.V.:363.3] Out: -     Labs  CBC Latest Ref Rng 09/12/2015 09/10/2015  WBC 3.6 - 11.0 K/uL 15.1(H) 13.9(H)  Hemoglobin 12.0 - 16.0 g/dL 11.1(L) 13.6  Hematocrit 35.0 - 47.0 % 32.3(L) 39.6  Platelets 150 - 440 K/uL 214 270   CMP Latest Ref Rng 09/11/2015 09/10/2015  Glucose 65 - 99 mg/dL 409(W144(H) 119(J135(H)  BUN 6 - 20 mg/dL 15 19  Creatinine 4.780.44 - 1.00 mg/dL 2.950.66 6.210.73  Sodium 308135 - 145 mmol/L 138 141  Potassium 3.5 - 5.1 mmol/L 3.3(L) 3.3(L)  Chloride 101 - 111 mmol/L 101 106  CO2 22 - 32 mmol/L 27 30  Calcium 8.9 - 10.3 mg/dL 8.3(L) 9.2  Total Protein 6.5 - 8.1 g/dL 5.9(L) 7.0  Total Bilirubin 0.3 - 1.2 mg/dL 0.9 0.8  Alkaline Phos 38 - 126 U/L 56 64  AST 15 - 41 U/L 24 19  ALT 14 - 54 U/L 35 24     IMP  She is stable postoperatively. No signs of bile leak.  Plan  round of Toradol. Discontinue SCDs. Incentive spirometry. Out of bed to chair. Advance to full liquids.

## 2015-09-13 LAB — CBC
HEMATOCRIT: 29.6 % — AB (ref 35.0–47.0)
HEMOGLOBIN: 10.1 g/dL — AB (ref 12.0–16.0)
MCH: 32.2 pg (ref 26.0–34.0)
MCHC: 34.1 g/dL (ref 32.0–36.0)
MCV: 94.6 fL (ref 80.0–100.0)
Platelets: 210 10*3/uL (ref 150–440)
RBC: 3.13 MIL/uL — ABNORMAL LOW (ref 3.80–5.20)
RDW: 13.1 % (ref 11.5–14.5)
WBC: 12.8 10*3/uL — ABNORMAL HIGH (ref 3.6–11.0)

## 2015-09-13 MED ORDER — OXYCODONE-ACETAMINOPHEN 5-325 MG PO TABS: 1.0000 | ORAL_TABLET | Freq: Four times a day (QID) | ORAL | Status: DC | PRN

## 2015-09-13 MED ORDER — KETOROLAC TROMETHAMINE 30 MG/ML IJ SOLN
30.0000 mg | Freq: Three times a day (TID) | INTRAMUSCULAR | Status: AC
  Administered 2015-09-13 – 2015-09-14 (×3): 30 mg via INTRAVENOUS
  Filled 2015-09-13 (×3): qty 1

## 2015-09-13 NOTE — Progress Notes (Signed)
Patient ID: Julia GaskinsSheila G Hartman, female   DOB: 1966/10/18, 49 y.o.   MRN: [id number] Surgery  POD 2   S/P open chole  Pain improved, seems controlled on PCA Wants regular diet No bile noted in JP.  Filed Vitals:   09/13/15 0500 09/13/15 0506 09/13/15 0741 09/13/15 0919  BP:  129/60  113/59  Pulse:  79  72  Temp:  99.6 F (37.6 C)  98.5 F (36.9 C)  TempSrc:  Oral  Oral  Resp:  23 21   Height:      Weight: 185 lb 1.6 oz (83.961 kg)     SpO2:  94% 93% 98%    PE:jp serous, abd soft, dressing intact.   Labs  CBC Latest Ref Rng 09/13/2015 09/12/2015 09/10/2015  WBC 3.6 - 11.0 K/uL 12.8(H) 15.1(H) 13.9(H)  Hemoglobin 12.0 - 16.0 g/dL 10.1(L) 11.1(L) 13.6  Hematocrit 35.0 - 47.0 % 29.6(L) 32.3(L) 39.6  Platelets 150 - 440 K/uL 210 214 270   CMP Latest Ref Rng 09/11/2015 09/10/2015  Glucose 65 - 99 mg/dL 409(W144(H) 119(J135(H)  BUN 6 - 20 mg/dL 15 19  Creatinine 4.780.44 - 1.00 mg/dL 2.950.66 6.210.73  Sodium 308135 - 145 mmol/L 138 141  Potassium 3.5 - 5.1 mmol/L 3.3(L) 3.3(L)  Chloride 101 - 111 mmol/L 101 106  CO2 22 - 32 mmol/L 27 30  Calcium 8.9 - 10.3 mg/dL 8.3(L) 9.2  Total Protein 6.5 - 8.1 g/dL 5.9(L) 7.0  Total Bilirubin 0.3 - 1.2 mg/dL 0.9 0.8  Alkaline Phos 38 - 126 U/L 56 64  AST 15 - 41 U/L 24 19  ALT 14 - 54 U/L 35 24    I/O last 3 completed shifts: In: 5013.8 [P.O.:780; I.V.:3983.8; Other:50; IV Piggyback:200] Out: 2827 [Urine:2750; Drains:77] Total I/O In: 334 [I.V.:334] Out: 405 [Urine:400; Drains:5]    IMP Stable  Plan advance diet, oral pain meds, anticipate dc home Saturday.

## 2015-09-14 LAB — CBC
HCT: 28.6 % — ABNORMAL LOW (ref 35.0–47.0)
HEMOGLOBIN: 9.8 g/dL — AB (ref 12.0–16.0)
MCH: 32.5 pg (ref 26.0–34.0)
MCHC: 34.2 g/dL (ref 32.0–36.0)
MCV: 95.2 fL (ref 80.0–100.0)
PLATELETS: 223 10*3/uL (ref 150–440)
RBC: 3 MIL/uL — ABNORMAL LOW (ref 3.80–5.20)
RDW: 13 % (ref 11.5–14.5)
WBC: 9 10*3/uL (ref 3.6–11.0)

## 2015-09-14 LAB — BASIC METABOLIC PANEL
Anion gap: 5 (ref 5–15)
BUN: 14 mg/dL (ref 6–20)
CALCIUM: 7.8 mg/dL — AB (ref 8.9–10.3)
CHLORIDE: 110 mmol/L (ref 101–111)
CO2: 26 mmol/L (ref 22–32)
CREATININE: 0.6 mg/dL (ref 0.44–1.00)
GFR calc Af Amer: >60 mL/min (ref >60)
GFR calc non Af Amer: >60 mL/min (ref >60)
Glucose, Bld: 125 mg/dL — ABNORMAL HIGH (ref 65–99)
Potassium: 3.6 mmol/L (ref 3.5–5.1)
SODIUM: 141 mmol/L (ref 135–145)

## 2015-09-14 MED ORDER — OXYCODONE-ACETAMINOPHEN 5-325 MG PO TABS: 1.0000 | ORAL_TABLET | Freq: Four times a day (QID) | ORAL | Status: DC | PRN

## 2015-09-14 MED ORDER — ONDANSETRON HCL 4 MG PO TABS: 4.0000 mg | ORAL_TABLET | Freq: Four times a day (QID) | ORAL | Status: DC | PRN

## 2015-09-14 NOTE — Discharge Instructions (Signed)
Cholecystitis Cholecystitis is inflammation of the gallbladder. It is often called a gallbladder attack. The gallbladder is a pear-shaped organ that lies beneath the liver on the right side of the body. The gallbladder stores bile, which is a fluid that helps the body to digest fats. If bile builds up in your gallbladder, your gallbladder becomes inflamed. This condition may occur suddenly (be acute). Repeat episodes of acute cholecystitis or prolonged episodes may lead to a long-term (chronic) condition. Cholecystitis is serious and it requires treatment.  CAUSES The most common cause of this condition is gallstones. Gallstones can block the tube (duct) that carries bile out of your gallbladder. This causes bile to build up. Other causes of this condition include:  Damage to the gallbladder due to a decrease in blood flow.  Infections in the bile ducts.  Scars or kinks in the bile ducts.  Tumors in the liver, pancreas, or gallbladder. RISK FACTORS This condition is more likely to develop in:  People who have sickle cell disease.  People who take birth control pills or use estrogen.  People who have alcoholic liver disease.  People who have liver cirrhosis.  People who have their nutrition delivered through a vein (parenteral nutrition).  People who do not eat or drink (do fasting) for a long period of time.  People who are obese.  People who have rapid weight loss.  People who are pregnant.  People who have increased triglyceride levels.  People who have pancreatitis. SYMPTOMS Symptoms of this condition include:  Abdominal pain, especially in the upper right area of the abdomen.  Abdominal tenderness or bloating.  Nausea.  Vomiting.  Fever.  Chills.  Yellowing of the skin and the whites of the eyes (jaundice). DIAGNOSIS This condition is diagnosed with a medical history and physical exam. You may also have other tests, including:  Imaging tests, such as:  An  ultrasound of the gallbladder.  A CT scan of the abdomen.  A gallbladder nuclear scan (HIDA scan). This scan allows your health care provider to see the bile moving from your liver to your gallbladder and to your small intestine.  MRI.  Blood tests, such as:  A complete blood count, because the white blood cell count may be higher than normal.  Liver function tests, because some levels may be higher than normal with certain types of gallstones. TREATMENT Treatment may include:  Fasting for a certain amount of time.  IV fluids.  Medicine to treat pain or vomiting.  Antibiotic medicine.  Surgery to remove your gallbladder (cholecystectomy). This may happen immediately or at a later time. HOME CARE INSTRUCTIONS Home care will depend on your treatment. In general:  Take over-the-counter and prescription medicines only as told by your health care provider.  If you were prescribed an antibiotic medicine, take it as told by your health care provider. Do not stop taking the antibiotic even if you start to feel better.  Follow instructions from your health care provider about what to eat or drink. When you are allowed to eat, avoid eating or drinking anything that triggers your symptoms.  Keep all follow-up visits as told by your health care provider. This is important. SEEK MEDICAL CARE IF:  Your pain is not controlled with medicine.  You have a fever. SEEK IMMEDIATE MEDICAL CARE IF:  Your pain moves to another part of your abdomen or to your back.  You continue to have symptoms or you develop new symptoms even with treatment.   This information   is not intended to replace advice given to you by your health care provider. Make sure you discuss any questions you have with your health care provider.   Document Released: 11/17/2005 Document Revised: 08/08/2015 Document Reviewed: 02/28/2015 Elsevier Interactive Patient Education 2016 [employer]  

## 2015-09-14 NOTE — Progress Notes (Signed)
Patient A/O no noted distress. Patient pain level has been managed by PCA pump. Staff will continue to monitor and meet needs. JP minimal drainage not enough to empty. Independent patient ambulates to bathroom.

## 2015-09-14 NOTE — Clinical Documentation Improvement (Signed)
General Surgery  Abnormal Lab/Test Results:    Component RBC Hemoglobin HCT  Latest Ref Rng 3.80 - 5.20 MIL/uL 12.0 - 16.0 g/dL 96.035.0 - 45.447.0 %  09/81/191410/09/2015 4.18 13.6 39.6  09/12/2015 3.38 (L) 11.1 (L) 32.3 (L)  09/13/2015 3.13 (L) 10.1 (L) 29.6 (L)  09/14/2015 3.00 (L) 9.8 (L) 28.6 (L)    Possible Clinical Conditions associated with below indicators  Acute blood loss anemia  Other Condition  Cannot Clinically Determine  Supporting Information: Intraop EBL 250cc per operative record.    Please exercise your independent, professional judgment when responding. A specific answer is not anticipated or expected.   Thank you, Doy MinceVangela Darold Miley, RN [phone] Clinical Documentation Specialist

## 2015-09-14 NOTE — Progress Notes (Signed)
Patient ID: Julia GaskinsSheila G Hartman, female   DOB: 05-27-1966, 49 y.o.   MRN: [id number]   Surgery  POD 3   S/P open chole  She is interested in going home. She is tolerating a regular diet.  Filed Vitals:   09/14/15 0400 09/14/15 0500 09/14/15 0611 09/14/15 0803  BP:   135/66 127/72  Pulse:   75 77  Temp:   99.3 F (37.4 C) 98.9 F (37.2 C)  TempSrc:   Oral Oral  Resp: 21  20   Height:      Weight:  189 lb (85.73 kg)    SpO2: 96%  96% 96%    PE:  Incision is clean dry and intact. Jackson-Pratt drain was removed. No bile was noted.  Labs  CBC Latest Ref Rng 09/14/2015 09/13/2015 09/12/2015  WBC 3.6 - 11.0 K/uL 9.0 12.8(H) 15.1(H)  Hemoglobin 12.0 - 16.0 g/dL 6.2(Z9.8(L) 10.1(L) 11.1(L)  Hematocrit 35.0 - 47.0 % 28.6(L) 29.6(L) 32.3(L)  Platelets 150 - 440 K/uL 223 210 214   CMP Latest Ref Rng 09/14/2015 09/11/2015 09/10/2015  Glucose 65 - 99 mg/dL 308(M125(H) 578(I144(H) 696(E135(H)  BUN 6 - 20 mg/dL 14 15 19   Creatinine 0.44 - 1.00 mg/dL 9.520.60 8.410.66 3.240.73  Sodium 135 - 145 mmol/L 141 138 141  Potassium 3.5 - 5.1 mmol/L 3.6 3.3(L) 3.3(L)  Chloride 101 - 111 mmol/L 110 101 106  CO2 22 - 32 mmol/L 26 27 30   Calcium 8.9 - 10.3 mg/dL 7.8(L) 8.3(L) 9.2  Total Protein 6.5 - 8.1 g/dL - 5.9(L) 7.0  Total Bilirubin 0.3 - 1.2 mg/dL - 0.9 0.8  Alkaline Phos 38 - 126 U/L - 56 64  AST 15 - 41 U/L - 24 19  ALT 14 - 54 U/L - 35 24    I/O last 3 completed shifts: In: 3011.7 [P.O.:480; I.V.:2331.7; IV Piggyback:200] Out: 2354.5 [Urine:2300; Drains:54.5]      IMP  she is stable for discharge.  Plan  I discussed the pathology report with the patient. Jackson-Pratt drain was removed. Discharged home on oral pain medications and Zofran. I will see her in my office this coming Wednesday.

## 2015-09-14 NOTE — Discharge Summary (Signed)
Physician Discharge Summary  Patient ID: Julia GaskinsSheila G Rockett MRN: [id number] DOB/AGE: 50-Mar-1967 50 y.o.  Admit date: 09/10/2015 Discharge date: 09/14/2015  Admission Diagnoses: Acute cholecystitis  Discharge Diagnoses:  Active Problems:   Acute cholecystitis   Biliary colic   Gall stones  Discharged Condition: Stable and improved  Hospital Course: The patient was admitted for abdominal pain. An MRCP was performed demonstrating no evidence of common bile duct stones. She was taken to the operating room the following day for laparoscopic cholecystectomy. Due to hepatoduodenal anatomy and acute on chronic inflammatory changes an open cholecystectomy was needed. Postoperatively she was doing well. Drain was left in place. Pain was well-controlled during her postoperative hospitalization drain was removed prior to discharge and she was in stable  condition for discharge on October 14.  Consults: None  Treatments: Cholecystectomy  Discharge Exam: Blood pressure 127/72, pulse 77, temperature 98.9 F (37.2 C), temperature source Oral, resp. rate 20, height 5\' 5"  (1.651 m), weight 189 lb (85.73 kg), SpO2 96 %.  Her abdomen was soft and nontender drain was removed there was no bile in the drain.  Disposition: Final discharge disposition not confirmed  Discharge Instructions    Discharge instructions    Complete by:  As directed   During your recent anesthetic, you were given the medication sugammadex (Bridion). This medication interacts with hormonal forms of birth control (oral contraceptives and injected or implanted birth control) and may make them ineffective. IFYOU USE ANY HORMONAL FORM OF BIRTH CONTROL, YOU MUST USE AN ADDITIONAL BARRIER BIRTH CONTROL FOR METHOD FOR SEVEN DAYS after receiving sugammadex (Bridion) or there is a chance you could become pregnant.            Medication List    TAKE these medications        ESTROVEN PO  Take 1 tablet by mouth daily.     ibuprofen  200 MG tablet  Commonly known as:  ADVIL,MOTRIN  Take 400 mg by mouth every 6 (six) hours as needed for headache or mild pain.     ondansetron 4 MG tablet  Commonly known as:  ZOFRAN  Take 1 tablet (4 mg total) by mouth every 6 (six) hours as needed for nausea.     oxyCODONE-acetaminophen 5-325 MG tablet  Commonly known as:  PERCOCET/ROXICET  Take 1-2 tablets by mouth every 6 (six) hours as needed for moderate pain.     triamterene-hydrochlorothiazide 37.5-25 MG tablet  Commonly known as:  MAXZIDE-25  Take 1 tablet by mouth daily.           Follow-up Information    Follow up with Natale LayMark Josip Merolla, MD On 09/19/2015.   Specialties:  Surgery, Radiology   Why:  For suture removal, [place] office location.   Contact information:   [address] [place] [id number] [phone]       Signed: Natale LayMark Idamay Hosein 09/14/2015, 8:53 AM

## 2015-09-17 ENCOUNTER — Other Ambulatory Visit: Payer: Self-pay | Admitting: *Deleted

## 2015-09-17 DIAGNOSIS — R51 Headache: Secondary | ICD-10-CM

## 2015-09-17 DIAGNOSIS — R739 Hyperglycemia, unspecified: Secondary | ICD-10-CM | POA: Insufficient documentation

## 2015-09-17 DIAGNOSIS — F419 Anxiety disorder, unspecified: Secondary | ICD-10-CM | POA: Insufficient documentation

## 2015-09-17 DIAGNOSIS — R519 Headache, unspecified: Secondary | ICD-10-CM | POA: Insufficient documentation

## 2015-09-17 DIAGNOSIS — N959 Unspecified menopausal and perimenopausal disorder: Secondary | ICD-10-CM | POA: Insufficient documentation

## 2015-09-17 DIAGNOSIS — E559 Vitamin D deficiency, unspecified: Secondary | ICD-10-CM | POA: Insufficient documentation

## 2015-09-17 DIAGNOSIS — I10 Essential (primary) hypertension: Secondary | ICD-10-CM | POA: Insufficient documentation

## 2015-09-17 DIAGNOSIS — R600 Localized edema: Secondary | ICD-10-CM | POA: Insufficient documentation

## 2015-09-21 ENCOUNTER — Ambulatory Visit (INDEPENDENT_AMBULATORY_CARE_PROVIDER_SITE_OTHER): Payer: BLUE CROSS/BLUE SHIELD | Admitting: Surgery

## 2015-09-21 ENCOUNTER — Encounter: Payer: Self-pay | Admitting: Surgery

## 2015-09-21 VITALS — BP 122/84 | HR 66 | Temp 98.9°F | Ht 66.0 in | Wt 176.0 lb

## 2015-09-21 DIAGNOSIS — K8050 Calculus of bile duct without cholangitis or cholecystitis without obstruction: Secondary | ICD-10-CM

## 2015-09-21 DIAGNOSIS — K810 Acute cholecystitis: Secondary | ICD-10-CM

## 2015-09-21 DIAGNOSIS — K8020 Calculus of gallbladder without cholecystitis without obstruction: Secondary | ICD-10-CM

## 2015-09-21 MED ORDER — OXYCODONE-ACETAMINOPHEN 5-325 MG PO TABS: 1.0000 | ORAL_TABLET | Freq: Four times a day (QID) | ORAL | Status: DC | PRN

## 2015-09-21 NOTE — Patient Instructions (Signed)
Follow up in two weeks in the [place] office.

## 2015-09-21 NOTE — Progress Notes (Signed)
Surgery clinic.  Patient's postoperative day #8 status post open cholecystectomy for acute calculus cholecystitis with empyema of the gallbladder.  She is doing well. Some complaints of pain. She is accompanied by her husband who was out of town during her hospitalization.  Filed Vitals:   09/21/15 1015  BP: 122/84  Pulse: 66  Temp: 98.9 F (37.2 C)    Wound is healing nicely. Staples removed and Steri-Strips and benzoin were applied. She tolerated this well.  Impression she is doing well.  Plan a refill on Percocet was provided. I like to see her back in 2 weeks' time in our [place] office location. She was in agreement.

## 2015-10-03 ENCOUNTER — Ambulatory Visit (INDEPENDENT_AMBULATORY_CARE_PROVIDER_SITE_OTHER): Payer: BLUE CROSS/BLUE SHIELD | Admitting: Surgery

## 2015-10-03 ENCOUNTER — Encounter: Payer: Self-pay | Admitting: Surgery

## 2015-10-03 VITALS — BP 129/78 | HR 90 | Temp 99.0°F | Ht 66.0 in | Wt 170.0 lb

## 2015-10-03 DIAGNOSIS — K8050 Calculus of bile duct without cholangitis or cholecystitis without obstruction: Secondary | ICD-10-CM

## 2015-10-03 DIAGNOSIS — K810 Acute cholecystitis: Secondary | ICD-10-CM

## 2015-10-03 DIAGNOSIS — K8020 Calculus of gallbladder without cholecystitis without obstruction: Secondary | ICD-10-CM

## 2015-10-03 NOTE — Progress Notes (Signed)
Surgery clinic.  The patient is nearly 3 weeks status post open cholecystectomy for gangrenous cholecystitis with empyema. She is slowly feeling better having intermittent pain in her right upper quadrant incision. She denies fevers jaundice nor difficulty eating.  Filed Vitals:   10/03/15 1347  BP: 129/78  Pulse: 90  Temp: 99 F (37.2 C)    On examination I see no evidence of an incisional hernia. Her incision is healed nicely. There is no drainage.  Impression doing well status post open cholecystectomy.  I recommended no abdominal wall exercises for a total of 6 weeks from the time of surgery. I'll see her back in the office as needed.

## 2016-06-26 ENCOUNTER — Emergency Department: Payer: BLUE CROSS/BLUE SHIELD

## 2016-06-26 ENCOUNTER — Encounter: Payer: Self-pay | Admitting: Emergency Medicine

## 2016-06-26 ENCOUNTER — Observation Stay
Admission: EM | Admit: 2016-06-26 | Discharge: 2016-06-27 | DRG: 690 | Disposition: A | Discharge status: Home / Self Care | Payer: BLUE CROSS/BLUE SHIELD | Attending: Internal Medicine | Admitting: Internal Medicine

## 2016-06-26 DIAGNOSIS — I10 Essential (primary) hypertension: Secondary | ICD-10-CM | POA: Diagnosis not present

## 2016-06-26 DIAGNOSIS — Z79899 Other long term (current) drug therapy: Secondary | ICD-10-CM | POA: Diagnosis not present

## 2016-06-26 DIAGNOSIS — E876 Hypokalemia: Secondary | ICD-10-CM | POA: Diagnosis not present

## 2016-06-26 DIAGNOSIS — E559 Vitamin D deficiency, unspecified: Secondary | ICD-10-CM | POA: Diagnosis present

## 2016-06-26 DIAGNOSIS — Z8249 Family history of ischemic heart disease and other diseases of the circulatory system: Secondary | ICD-10-CM | POA: Diagnosis not present

## 2016-06-26 DIAGNOSIS — N12 Tubulo-interstitial nephritis, not specified as acute or chronic: Secondary | ICD-10-CM

## 2016-06-26 DIAGNOSIS — N10 Acute tubulo-interstitial nephritis: Secondary | ICD-10-CM | POA: Diagnosis present

## 2016-06-26 LAB — URINALYSIS COMPLETE WITH MICROSCOPIC (ARMC ONLY)
BILIRUBIN URINE: NEGATIVE
GLUCOSE, UA: NEGATIVE mg/dL
KETONES UR: NEGATIVE mg/dL
Nitrite: NEGATIVE
PROTEIN: 100 mg/dL — AB
SPECIFIC GRAVITY, URINE: 1.013 (ref 1.005–1.030)
pH: 6 (ref 5.0–8.0)

## 2016-06-26 LAB — COMPREHENSIVE METABOLIC PANEL
ALBUMIN: 3.4 g/dL — AB (ref 3.5–5.0)
ALT: 17 U/L (ref 14–54)
AST: 17 U/L (ref 15–41)
Alkaline Phosphatase: 76 U/L (ref 38–126)
Anion gap: 10 (ref 5–15)
BILIRUBIN TOTAL: 0.8 mg/dL (ref 0.3–1.2)
BUN: 16 mg/dL (ref 6–20)
CHLORIDE: 95 mmol/L — AB (ref 101–111)
CO2: 30 mmol/L (ref 22–32)
CREATININE: 0.95 mg/dL (ref 0.44–1.00)
Calcium: 8.5 mg/dL — ABNORMAL LOW (ref 8.9–10.3)
GFR calc Af Amer: >60 mL/min (ref >60)
GLUCOSE: 161 mg/dL — AB (ref 65–99)
Potassium: 2.5 mmol/L — CL (ref 3.5–5.1)
Sodium: 135 mmol/L (ref 135–145)
TOTAL PROTEIN: 7.6 g/dL (ref 6.5–8.1)

## 2016-06-26 LAB — CBC
HEMATOCRIT: 40 % (ref 35.0–47.0)
Hemoglobin: 13.8 g/dL (ref 12.0–16.0)
MCH: 31.9 pg (ref 26.0–34.0)
MCHC: 34.5 g/dL (ref 32.0–36.0)
MCV: 92.4 fL (ref 80.0–100.0)
PLATELETS: 198 10*3/uL (ref 150–440)
RBC: 4.33 MIL/uL (ref 3.80–5.20)
RDW: 12.9 % (ref 11.5–14.5)
WBC: 18 10*3/uL — AB (ref 3.6–11.0)

## 2016-06-26 LAB — MAGNESIUM: MAGNESIUM: 2.2 mg/dL (ref 1.7–2.4)

## 2016-06-26 LAB — LACTIC ACID, PLASMA: Lactic Acid, Venous: 1.1 mmol/L (ref 0.5–1.9)

## 2016-06-26 LAB — LIPASE, BLOOD: Lipase: 13 U/L (ref 11–51)

## 2016-06-26 MED ORDER — ACETAMINOPHEN 650 MG RE SUPP: 650.0000 mg | Freq: Four times a day (QID) | RECTAL | Status: DC | PRN

## 2016-06-26 MED ORDER — SODIUM CHLORIDE 0.9 % IV BOLUS (SEPSIS)
1000.0000 mL | Freq: Once | INTRAVENOUS | Status: AC
  Administered 2016-06-26: 1000 mL via INTRAVENOUS

## 2016-06-26 MED ORDER — DEXTROSE 5 % IV SOLN
1.0000 g | Freq: Once | INTRAVENOUS | Status: AC
  Administered 2016-06-26: 1 g via INTRAVENOUS
  Filled 2016-06-26: qty 10

## 2016-06-26 MED ORDER — POTASSIUM CHLORIDE CRYS ER 20 MEQ PO TBCR
40.0000 meq | EXTENDED_RELEASE_TABLET | Freq: Once | ORAL | Status: AC
  Administered 2016-06-26: 40 meq via ORAL
  Filled 2016-06-26: qty 2

## 2016-06-26 MED ORDER — DEXTROSE 5 % IV SOLN
1.0000 g | INTRAVENOUS | Status: DC
  Filled 2016-06-26: qty 10

## 2016-06-26 MED ORDER — ACETAMINOPHEN 500 MG PO TABS
1000.0000 mg | ORAL_TABLET | Freq: Once | ORAL | Status: AC
  Administered 2016-06-26: 1000 mg via ORAL
  Filled 2016-06-26: qty 2

## 2016-06-26 MED ORDER — ONDANSETRON HCL 4 MG PO TABS: 4.0000 mg | ORAL_TABLET | Freq: Four times a day (QID) | ORAL | Status: DC | PRN

## 2016-06-26 MED ORDER — POTASSIUM CHLORIDE IN NACL 20-0.9 MEQ/L-% IV SOLN
INTRAVENOUS | Status: DC
  Administered 2016-06-26 – 2016-06-27 (×2): via INTRAVENOUS
  Filled 2016-06-26 (×5): qty 1000

## 2016-06-26 MED ORDER — ONDANSETRON HCL 4 MG/2ML IJ SOLN: 4.0000 mg | Freq: Four times a day (QID) | INTRAMUSCULAR | Status: DC | PRN

## 2016-06-26 MED ORDER — ACETAMINOPHEN 325 MG PO TABS
650.0000 mg | ORAL_TABLET | Freq: Four times a day (QID) | ORAL | Status: DC | PRN
  Administered 2016-06-27: 650 mg via ORAL
  Filled 2016-06-26: qty 2

## 2016-06-26 MED ORDER — MORPHINE SULFATE (PF) 2 MG/ML IV SOLN: 1.0000 mg | INTRAVENOUS | Status: DC | PRN

## 2016-06-26 MED ORDER — ONDANSETRON HCL 4 MG/2ML IJ SOLN
4.0000 mg | Freq: Once | INTRAMUSCULAR | Status: AC
  Administered 2016-06-26: 4 mg via INTRAVENOUS
  Filled 2016-06-26: qty 2

## 2016-06-26 MED ORDER — POTASSIUM CHLORIDE 10 MEQ/100ML IV SOLN
10.0000 meq | INTRAVENOUS | Status: AC
  Administered 2016-06-26 (×3): 10 meq via INTRAVENOUS
  Filled 2016-06-26 (×3): qty 100

## 2016-06-26 MED ORDER — MORPHINE SULFATE (PF) 4 MG/ML IV SOLN
4.0000 mg | Freq: Once | INTRAVENOUS | Status: AC
  Administered 2016-06-26: 4 mg via INTRAVENOUS
  Filled 2016-06-26: qty 1

## 2016-06-26 MED ORDER — POTASSIUM CHLORIDE CRYS ER 20 MEQ PO TBCR
20.0000 meq | EXTENDED_RELEASE_TABLET | Freq: Two times a day (BID) | ORAL | Status: DC
  Administered 2016-06-26 – 2016-06-27 (×2): 20 meq via ORAL
  Filled 2016-06-26 (×2): qty 1

## 2016-06-26 MED ORDER — ENOXAPARIN SODIUM 40 MG/0.4ML ~~LOC~~ SOLN
40.0000 mg | SUBCUTANEOUS | Status: DC
  Administered 2016-06-26: 40 mg via SUBCUTANEOUS
  Filled 2016-06-26: qty 0.4

## 2016-06-26 NOTE — ED Triage Notes (Signed)
Pt presents to ED from [facility] with reports of abnormal lab values. Pt reports right side flank pain, dysuria, vomiting and malaise for approximately one week.

## 2016-06-26 NOTE — ED Notes (Signed)
RN called at this time , can not get report at this time. Receiving RN will call back

## 2016-06-26 NOTE — ED Provider Notes (Signed)
[facility]-EMERGENCY DEPARTMENT Provider Note   CSN: [id number] Arrival date & time: 06/26/16  1426  First Provider Contact:  First MD Initiated Contact with Patient 06/26/16 1511        History   Chief Complaint Chief Complaint  Patient presents with  . Flank Pain  . Dysuria    HPI Julia Hartman is a 50 y.o. female hx of Hypertension here presenting with dysuria, fevers. Fevers and chills for the last week or so. Also has right flank pain and back pain. Initially she thought it was from back strain. However it progressively got worse. She states that she has been running a fever for the last week and most recently this morning. She also has poor appetite and has been unable to keep much things down. She went to the clinic and was diagnosed with pyelonephritis and had a white count of 18,000 and K 2.5. UA + UTI, sent for IV abx. She noticed dysuria, trouble urinating     The history is provided by the patient.    Past Medical History:  Diagnosis Date  . Hypertension     Patient Active Problem List   Diagnosis Date Noted  . Avitaminosis D 09/17/2015  . Menopausal and perimenopausal disorder 09/17/2015  . Edema leg 09/17/2015  . BP (high blood pressure) 09/17/2015  . Blood glucose elevated 09/17/2015  . Cephalalgia 09/17/2015  . Anxiety 09/17/2015  . Biliary colic   . Gall stones   . Acute cholecystitis 09/10/2015    Past Surgical History:  Procedure Laterality Date  . CHOLECYSTECTOMY N/A 09/11/2015   Procedure: LAPAROSCOPIC CHOLECYSTECTOMY CONVERTED TO OPEN CHOLECYSTECTOMY;  Surgeon: Natale Lay, MD;  Location: [facility] ORS;  Service: General;  Laterality: N/A;    OB History    No data available       Home Medications    Prior to Admission medications   Medication Sig Start Date End Date Taking? Authorizing Provider  ibuprofen (ADVIL,MOTRIN) 200 MG tablet Take 400 mg by mouth every 6 (six) hours as needed for headache or mild pain.   Yes Historical Provider, MD    Nutritional Supplements (ESTROVEN PO) Take 1 tablet by mouth daily.   Yes Historical Provider, MD  triamterene-hydrochlorothiazide (MAXZIDE-25) 37.5-25 MG tablet Take 1 tablet by mouth daily.   Yes Historical Provider, MD    Family History Family History  Problem Relation Age of Onset  . Hypertension Mother   . Hypertension Brother   . Hypertension Maternal Grandfather     Social History Social History  Substance Use Topics  . Smoking status: Never Smoker  . Smokeless tobacco: Never Used  . Alcohol use Yes     Allergies   Review of patient's allergies indicates no known allergies.   Review of Systems Review of Systems  Genitourinary: Positive for dysuria and flank pain.  All other systems reviewed and are negative.    Physical Exam Updated Vital Signs BP 112/69   Pulse 77   Temp 99.6 F (37.6 C) (Oral)   Resp (!) 21   Ht 5\' 5"  (1.651 m)   Wt 170 lb (77.1 kg)   SpO2 96%   BMI 28.29 kg/m   Physical Exam  Constitutional: She is oriented to person, place, and time. She appears well-developed.  HENT:  Head: Normocephalic.  Eyes: EOM are normal. Pupils are equal, round, and reactive to light.  Neck: Normal range of motion.  Cardiovascular:  Tachycardic   Pulmonary/Chest: Effort normal and breath sounds normal.  Abdominal: Soft.  Bowel sounds are normal.  + R CVAT   Musculoskeletal: Normal range of motion.  Neurological: She is alert and oriented to person, place, and time.  Skin: Skin is warm.  Psychiatric: She has a normal mood and affect.  Nursing note and vitals reviewed.    ED Treatments / Results  Labs (all labs ordered are listed, but only abnormal results are displayed) Labs Reviewed  COMPREHENSIVE METABOLIC PANEL - Abnormal; Notable for the following:       Result Value   Potassium 2.5 (*)    Chloride 95 (*)    Glucose, Bld 161 (*)    Calcium 8.5 (*)    Albumin 3.4 (*)    All other components within normal limits  CBC - Abnormal; Notable  for the following:    WBC 18.0 (*)    All other components within normal limits  URINALYSIS COMPLETEWITH MICROSCOPIC ([facility] ONLY) - Abnormal; Notable for the following:    Color, Urine YELLOW (*)    APPearance HAZY (*)    Hgb urine dipstick 2+ (*)    Protein, ur 100 (*)    Leukocytes, UA 2+ (*)    Bacteria, UA FEW (*)    Squamous Epithelial / LPF 0-5 (*)    All other components within normal limits  URINE CULTURE  CULTURE, BLOOD (ROUTINE X 2)  CULTURE, BLOOD (ROUTINE X 2)  LIPASE, BLOOD  LACTIC ACID, PLASMA    EKG  EKG Interpretation None      ED ECG REPORT I, Jaidon Sponsel, the attending physician, personally viewed and interpreted this ECG.   Date: 06/26/2016  EKG Time: 15:25 pm  Rate: 79  Rhythm: normal EKG, normal sinus rhythm  Axis: normal  Intervals:none  ST&T Change: prolonged QT  Radiology Ct Renal Stone Study  Result Date: 06/26/2016 CLINICAL DATA:  Right side flank pain, dysuria, hematuria, vomiting EXAM: CT ABDOMEN AND PELVIS WITHOUT CONTRAST TECHNIQUE: Multidetector CT imaging of the abdomen and pelvis was performed following the standard protocol without IV contrast. COMPARISON:  09/10/2015 FINDINGS: Lower chest: Lung bases are clear. No effusions. Heart is normal size. Hepatobiliary: Prior cholecystectomy.  No focal hepatic abnormality. Pancreas: No focal abnormality or ductal dilatation. Spleen: No focal abnormality.  Normal size. Adrenals/Urinary Tract: Extensive perinephric stranding noted around the right kidney. No hydronephrosis. No visible renal or ureteral stones. Adrenal glands and urinary bladder unremarkable. Stomach/Bowel: Appendix is normal. Stomach, large and small bowel grossly unremarkable. Vascular/Lymphatic: No evidence of aneurysm or adenopathy. Reproductive: Uterus and adnexa unremarkable.  No mass. Other: No free fluid or free air. Musculoskeletal: No acute bony abnormality or focal bone lesion. IMPRESSION: Extensive stranding around the right  kidney without visible stones or hydronephrosis. Findings may the related to pyelonephritis. Normal appendix. Prior cholecystectomy. Electronically Signed   By: Charlett Nose M.D.   On: 06/26/2016 16:00   Procedures Procedures (including critical care time)  Medications Ordered in ED Medications  potassium chloride 10 mEq in 100 mL IVPB (10 mEq Intravenous New Bag/Given 06/26/16 1803)  cefTRIAXone (ROCEPHIN) 1 g in dextrose 5 % 50 mL IVPB (0 g Intravenous Stopped 06/26/16 1607)  acetaminophen (TYLENOL) tablet 1,000 mg (1,000 mg Oral Given 06/26/16 1524)  ondansetron (ZOFRAN) injection 4 mg (4 mg Intravenous Given 06/26/16 1524)  potassium chloride SA (K-DUR,KLOR-CON) CR tablet 40 mEq (40 mEq Oral Given 06/26/16 1524)  sodium chloride 0.9 % bolus 1,000 mL (0 mLs Intravenous Stopped 06/26/16 1807)  morphine 4 MG/ML injection 4 mg (4 mg Intravenous Given 06/26/16  1620)      Initial Impression / Assessment and Plan / ED Course  I have reviewed the triage vital signs and the nursing notes.  Pertinent labs & imaging results that were available during my care of the patient were reviewed by me and considered in my medical decision making (see chart for details).  Clinical Course    Julia Hartman is a 50 y.o. female  Here with R flank pain, fever, dysuria. Concerned for pyelo. Will do sepsis workup. Will hydrate and reassess. Will get CT renal stone to r/o infected stone.   6:22 PM WBC 18. K 2.5, supplemented. Given rocephin. CT showed fat stranding, no abscess or stone. Will admit for pain control.      Final Clinical Impressions(s) / ED Diagnoses   Final diagnoses:  None    New Prescriptions New Prescriptions   No medications on file     Charlynne Pander, MD 06/26/16 Rickey Primus

## 2016-06-26 NOTE — H&P (Signed)
[facility] [place] at [facility]   PATIENT NAME: Julia Hartman    MR#:  [id number]  DATE OF BIRTH:  November 18, 1966  DATE OF ADMISSION:  06/26/2016  PRIMARY CARE PHYSICIAN: Lynnea Ferrier, MD   REQUESTING/REFERRING PHYSICIAN: Dr. Gladstone Pih  CHIEF COMPLAINT:   Chief Complaint  Patient presents with  . Flank Pain  . Dysuria    HISTORY OF PRESENT ILLNESS:  Julia Hartman  is a 50 y.o. female with a known history of Essential hypertension who presents to the hospital due to lower back/right flank pain associated with some nausea and some urinary frequency. Patient says she developed some urinary frequency last week and thought was related to UTI and tried to drink plenty of fluids this evening to improve her symptoms. She then this week started develop some lower back and right flank pain and just overall was not feeling very well attempted to go to urgent care but then referred her to the ER for further evaluation. Patient presented to the ER was noted to have a fever of 101, she also associated fever 102 at home. She admits to some chills, nausea but no vomiting. She does state that she's been having some cloudy foul smelling urine with the past few days. Patient presented to the ER was noted to have a positive urinalysis consistent with UTI, her CT scan was also suggestive of acute pyelonephritis. Hospitalist services were contacted further treatment and evaluation.  PAST MEDICAL HISTORY:   Past Medical History:  Diagnosis Date  . Hypertension     PAST SURGICAL HISTORY:   Past Surgical History:  Procedure Laterality Date  . CHOLECYSTECTOMY N/A 09/11/2015   Procedure: LAPAROSCOPIC CHOLECYSTECTOMY CONVERTED TO OPEN CHOLECYSTECTOMY;  Surgeon: Natale Lay, MD;  Location: [facility] ORS;  Service: General;  Laterality: N/A;    SOCIAL HISTORY:   Social History  Substance Use Topics  . Smoking status: Never Smoker  . Smokeless tobacco: Never Used  . Alcohol use  Yes    FAMILY HISTORY:   Family History  Problem Relation Age of Onset  . Hypertension Mother   . Diabetes Mother   . Hypertension Brother   . Hypertension Maternal Grandfather   . Diabetes Father   . Hypertension Father     DRUG ALLERGIES:  No Known Allergies  REVIEW OF SYSTEMS:   Review of Systems  Constitutional: Negative for fever and weight loss.  HENT: Negative for congestion, nosebleeds and tinnitus.   Eyes: Negative for blurred vision, double vision and redness.  Respiratory: Negative for cough, hemoptysis and shortness of breath.   Cardiovascular: Negative for chest pain, orthopnea, leg swelling and PND.  Gastrointestinal: Positive for abdominal pain and nausea. Negative for diarrhea, melena and vomiting.  Genitourinary: Positive for flank pain and urgency. Negative for dysuria and hematuria.  Musculoskeletal: Negative for falls and joint pain.  Neurological: Negative for dizziness, tingling, sensory change, focal weakness, seizures, weakness and headaches.  Endo/Heme/Allergies: Negative for polydipsia. Does not bruise/bleed easily.  Psychiatric/Behavioral: Negative for depression and memory loss. The patient is not nervous/anxious.     MEDICATIONS AT HOME:   Prior to Admission medications   Medication Sig Start Date End Date Taking? Authorizing Provider  ibuprofen (ADVIL,MOTRIN) 200 MG tablet Take 400 mg by mouth every 6 (six) hours as needed for headache or mild pain.   Yes Historical Provider, MD  Nutritional Supplements (ESTROVEN PO) Take 1 tablet by mouth daily.   Yes Historical Provider, MD  triamterene-hydrochlorothiazide (MAXZIDE-25) 37.5-25 MG  tablet Take 1 tablet by mouth daily.   Yes Historical Provider, MD      VITAL SIGNS:  Blood pressure 101/73, pulse 78, temperature 99.6 F (37.6 C), temperature source Oral, resp. rate 18, height 5\' 5"  (1.651 m), weight 77.1 kg (170 lb), SpO2 96 %.  PHYSICAL EXAMINATION:  Physical Exam  GENERAL:  50  y.o.-year-old patient lying in the bed in no acute distress.  EYES: Pupils equal, round, reactive to light and accommodation. No scleral icterus. Extraocular muscles intact.  HEENT: Head atraumatic, normocephalic. Oropharynx and nasopharynx clear. No oropharyngeal erythema, dry oral mucosa  NECK:  Supple, no jugular venous distention. No thyroid enlargement, no tenderness.  LUNGS: Normal breath sounds bilaterally, no wheezing, rales, rhonchi. No use of accessory muscles of respiration.  CARDIOVASCULAR: S1, S2 RRR. No murmurs, rubs, gallops, clicks.  ABDOMEN: Soft, mildly tender in right flank, no rebound, rigidity, nondistended. Bowel sounds present. No organomegaly or mass.  EXTREMITIES: No pedal edema, cyanosis, or clubbing. + 2 pedal & radial pulses b/l.   NEUROLOGIC: Cranial nerves II through XII are intact. No focal Motor or sensory deficits appreciated b/l PSYCHIATRIC: The patient is alert and oriented x 3. Good affect.  SKIN: No obvious rash, lesion, or ulcer.   LABORATORY PANEL:   CBC  Recent Labs Lab 06/26/16 1439  WBC 18.0*  HGB 13.8  HCT 40.0  PLT 198   ------------------------------------------------------------------------------------------------------------------  Chemistries   Recent Labs Lab 06/26/16 1439  NA 135  K 2.5*  CL 95*  CO2 30  GLUCOSE 161*  BUN 16  CREATININE 0.95  CALCIUM 8.5*  AST 17  ALT 17  ALKPHOS 76  BILITOT 0.8   ------------------------------------------------------------------------------------------------------------------  Cardiac Enzymes No results for input(s): TROPONINI in the last 168 hours. ------------------------------------------------------------------------------------------------------------------  RADIOLOGY:  Ct Renal Stone Study  Result Date: 06/26/2016 CLINICAL DATA:  Right side flank pain, dysuria, hematuria, vomiting EXAM: CT ABDOMEN AND PELVIS WITHOUT CONTRAST TECHNIQUE: Multidetector CT imaging of the abdomen  and pelvis was performed following the standard protocol without IV contrast. COMPARISON:  09/10/2015 FINDINGS: Lower chest: Lung bases are clear. No effusions. Heart is normal size. Hepatobiliary: Prior cholecystectomy.  No focal hepatic abnormality. Pancreas: No focal abnormality or ductal dilatation. Spleen: No focal abnormality.  Normal size. Adrenals/Urinary Tract: Extensive perinephric stranding noted around the right kidney. No hydronephrosis. No visible renal or ureteral stones. Adrenal glands and urinary bladder unremarkable. Stomach/Bowel: Appendix is normal. Stomach, large and small bowel grossly unremarkable. Vascular/Lymphatic: No evidence of aneurysm or adenopathy. Reproductive: Uterus and adnexa unremarkable.  No mass. Other: No free fluid or free air. Musculoskeletal: No acute bony abnormality or focal bone lesion. IMPRESSION: Extensive stranding around the right kidney without visible stones or hydronephrosis. Findings may the related to pyelonephritis. Normal appendix. Prior cholecystectomy. Electronically Signed   By: Charlett Nose M.D.   On: 06/26/2016 16:00    IMPRESSION AND PLAN:   50 year old female with past medical history of essential hypertension presents to the hospital due to fever, malaise, cloudy urine and chills and noted to have acute pyelonephritis.  1. Acute pyelonephritis-this is the cause of patient's flank abdominal pain, fever, chills and malaise. -I will start her on supportive care with IV fluids, antiemetics, pain control. I will place on IV ceftriaxone. -Follow urine cultures. Follow clinically.  2. Hypokalemia-I will place her on oral potassium supplements, recheck level in the morning. -Check magnesium level.  3. Leukocytosis-secondary to the acute pyelonephritis. Follow with treatment.  4. Essential hypertension-presently hemodynamically stable. -Hold  triamterene HCTZ given her severe hypokalemia.    All the records are reviewed and case discussed  with ED provider. Management plans discussed with the patient, family and they are in agreement.  CODE STATUS: Full  TOTAL TIME TAKING CARE OF THIS PATIENT: 45 minutes.    Houston Siren M.D on 06/26/2016 at 6:40 PM  Between 7am to 6pm - Pager - [phone]  After 6pm go to [email or web address] - password [email or web address] [facility]  [place] [facility]  Office  [phone]  CC: Primary care physician; Lynnea Ferrier, MD

## 2016-06-27 LAB — BASIC METABOLIC PANEL
ANION GAP: 8 (ref 5–15)
BUN: 14 mg/dL (ref 6–20)
CHLORIDE: 104 mmol/L (ref 101–111)
CO2: 24 mmol/L (ref 22–32)
Calcium: 7.5 mg/dL — ABNORMAL LOW (ref 8.9–10.3)
Creatinine, Ser: 0.74 mg/dL (ref 0.44–1.00)
GFR calc non Af Amer: >60 mL/min (ref >60)
Glucose, Bld: 144 mg/dL — ABNORMAL HIGH (ref 65–99)
Potassium: 3.3 mmol/L — ABNORMAL LOW (ref 3.5–5.1)
Sodium: 136 mmol/L (ref 135–145)

## 2016-06-27 LAB — CBC
HEMATOCRIT: 35.8 % (ref 35.0–47.0)
HEMOGLOBIN: 12.1 g/dL (ref 12.0–16.0)
MCH: 31.8 pg (ref 26.0–34.0)
MCHC: 33.9 g/dL (ref 32.0–36.0)
MCV: 93.8 fL (ref 80.0–100.0)
Platelets: 179 10*3/uL (ref 150–440)
RBC: 3.82 MIL/uL (ref 3.80–5.20)
RDW: 13 % (ref 11.5–14.5)
WBC: 15.6 10*3/uL — ABNORMAL HIGH (ref 3.6–11.0)

## 2016-06-27 MED ORDER — POTASSIUM CHLORIDE CRYS ER 20 MEQ PO TBCR
40.0000 meq | EXTENDED_RELEASE_TABLET | Freq: Once | ORAL | Status: AC
Start: 2016-06-27 — End: 2016-06-27
  Administered 2016-06-27: 40 meq via ORAL
  Filled 2016-06-27: qty 2

## 2016-06-27 MED ORDER — CEFUROXIME AXETIL 500 MG PO TABS: 500.0000 mg | ORAL_TABLET | Freq: Two times a day (BID) | ORAL | 0 refills | Status: AC

## 2016-06-27 NOTE — Progress Notes (Signed)
Discharge instructions reviewed with patient and patient verbalized understanding. Rx sent to pharmacy electronically. Patient unsatisfied with follow up appointment date of July 10, 2016. Patient stated that she would call primary MD's office to see if follow up appointment could be moved up closer. Wheelchair called for discharge.

## 2016-06-27 NOTE — Discharge Summary (Signed)
[facility] - [place] at [facility]   PATIENT NAME: Julia Hartman    MR#:  [id number]  DATE OF BIRTH:  September 18, 1966  DATE OF ADMISSION:  06/26/2016 ADMITTING PHYSICIAN: Houston Siren, MD  DATE OF DISCHARGE: 06/27/2016  PRIMARY CARE PHYSICIAN: Curtis Sites III, MD    ADMISSION DIAGNOSIS:  Hypokalemia [E87.6] Pyelonephritis [N12]  DISCHARGE DIAGNOSIS:  Active Problems:   Pyelonephritis   SECONDARY DIAGNOSIS:   Past Medical History:  Diagnosis Date  . Hypertension     HOSPITAL COURSE:   50 year old female with a history of essential hypertension who presents with flank pain, fever and dysuria found to acute pyelonephritis.  1. Acute pyelonephritis with right flank pain: Patient was admitted to the hospital and started on IV Rocephin. CT scan showed extensive stranding around the right kidney without visible stones related to pyelonephritis.  Her symptoms have completely resolved. She was afebrile however was discussed that patients with acute pyelonephritis despite being on correct antibiotics may have fevers up to 3 days. If she should have fevers longer than this then she needs to be reevaluated. She was transitioned to oral Ceftin at discharge, she responded very well to IV Rocephin.. Her PCP can follow-up on final urine culture.  2. Severe hypokalemia: Potassium was repleted.   3. Leukocytosis: This is due to acute pyelonephritis. Blood cell count has also improved.  4. Essential hypertension: Continue Maxide.    DISCHARGE CONDITIONS AND DIET:   Stable for discharge on heart healthy diet.   CONSULTS OBTAINED:    DRUG ALLERGIES:  No Known Allergies  DISCHARGE MEDICATIONS:   Current Discharge Medication List    START taking these medications   Details  cefUROXime (CEFTIN) 500 MG tablet Take 1 tablet (500 mg total) by mouth 2 (two) times daily with a meal. Qty: 24 tablet, Refills: 0      CONTINUE these medications which have NOT  CHANGED   Details  ibuprofen (ADVIL,MOTRIN) 200 MG tablet Take 400 mg by mouth every 6 (six) hours as needed for headache or mild pain.    Nutritional Supplements (ESTROVEN PO) Take 1 tablet by mouth daily.    triamterene-hydrochlorothiazide (MAXZIDE-25) 37.5-25 MG tablet Take 1 tablet by mouth daily.              Today   CHIEF COMPLAINT:  She is doing well this morning. She says she feels 100% better than on admission.   VITAL SIGNS:  Blood pressure 117/70, pulse 80, temperature 99.7 F (37.6 C), temperature source Oral, resp. rate 20, height 5\' 5"  (1.651 m), weight 77.1 kg (170 lb), SpO2 95 %.   REVIEW OF SYSTEMS:  Review of Systems  Constitutional: Negative.  Negative for chills, fever and malaise/fatigue.  HENT: Negative.  Negative for ear discharge, ear pain, hearing loss, nosebleeds and sore throat.   Eyes: Negative.  Negative for blurred vision and pain.  Respiratory: Negative.  Negative for cough, hemoptysis, shortness of breath and wheezing.   Cardiovascular: Negative.  Negative for chest pain, palpitations and leg swelling.  Gastrointestinal: Negative.  Negative for abdominal pain, blood in stool, diarrhea, nausea and vomiting.  Genitourinary: Negative.  Negative for dysuria.  Musculoskeletal: Negative.  Negative for back pain.  Skin: Negative.   Neurological: Negative for dizziness, tremors, speech change, focal weakness, seizures and headaches.  Endo/Heme/Allergies: Negative.  Does not bruise/bleed easily.  Psychiatric/Behavioral: Negative.  Negative for depression, hallucinations and suicidal ideas.     PHYSICAL EXAMINATION:  GENERAL:  50 y.o.-year-old patient  lying in the bed with no acute distress.  NECK:  Supple, no jugular venous distention. No thyroid enlargement, no tenderness.  LUNGS: Normal breath sounds bilaterally, no wheezing, rales,rhonchi  No use of accessory muscles of respiration.  CARDIOVASCULAR: S1, S2 normal. No murmurs, rubs, or  gallops.  ABDOMEN: Soft, non-tender, non-distended. Bowel sounds present. No organomegaly or mass. Does not have right flank pain  EXTREMITIES: No pedal edema, cyanosis, or clubbing.  PSYCHIATRIC: The patient is alert and oriented x 3.  SKIN: No obvious rash, lesion, or ulcer.   DATA REVIEW:   CBC  Recent Labs Lab 06/27/16 0408  WBC 15.6*  HGB 12.1  HCT 35.8  PLT 179    Chemistries   Recent Labs Lab 06/26/16 1439 06/27/16 0408  NA 135 136  K 2.5* 3.3*  CL 95* 104  CO2 30 24  GLUCOSE 161* 144*  BUN 16 14  CREATININE 0.95 0.74  CALCIUM 8.5* 7.5*  MG 2.2  --   AST 17  --   ALT 17  --   ALKPHOS 76  --   BILITOT 0.8  --     Cardiac Enzymes No results for input(s): TROPONINI in the last 168 hours.  Microbiology Results  @  RADIOLOGY:  Ct Renal Stone Study  Result Date: 06/26/2016 CLINICAL DATA:  Right side flank pain, dysuria, hematuria, vomiting EXAM: CT ABDOMEN AND PELVIS WITHOUT CONTRAST TECHNIQUE: Multidetector CT imaging of the abdomen and pelvis was performed following the standard protocol without IV contrast. COMPARISON:  09/10/2015 FINDINGS: Lower chest: Lung bases are clear. No effusions. Heart is normal size. Hepatobiliary: Prior cholecystectomy.  No focal hepatic abnormality. Pancreas: No focal abnormality or ductal dilatation. Spleen: No focal abnormality.  Normal size. Adrenals/Urinary Tract: Extensive perinephric stranding noted around the right kidney. No hydronephrosis. No visible renal or ureteral stones. Adrenal glands and urinary bladder unremarkable. Stomach/Bowel: Appendix is normal. Stomach, large and small bowel grossly unremarkable. Vascular/Lymphatic: No evidence of aneurysm or adenopathy. Reproductive: Uterus and adnexa unremarkable.  No mass. Other: No free fluid or free air. Musculoskeletal: No acute bony abnormality or focal bone lesion. IMPRESSION: Extensive stranding around the right kidney without visible stones or  hydronephrosis. Findings may the related to pyelonephritis. Normal appendix. Prior cholecystectomy. Electronically Signed   By: Charlett Nose M.D.   On: 06/26/2016 16:00     Management plans discussed with the patient and she is in agreement. Stable for discharge home  Patient should follow up with pcp   CODE STATUS:     Code Status Orders        Start     Ordered   06/26/16 2000  Full code  Continuous     06/26/16 1959    Code Status History    Date Active Date Inactive Code Status Order ID Comments User Context   09/10/2015  7:48 PM 09/14/2015  3:00 PM Full Code [id number]  Natale Lay, MD Inpatient      TOTAL TIME TAKING CARE OF THIS PATIENT: 35 minutes.    Note: This dictation was prepared with Dragon dictation along with smaller phrase technology. Any transcriptional errors that result from this process are unintentional.  Julia Hartman M.D on 06/27/2016 at 10:52 AM  Between 7am to 6pm - Pager - [phone] After 6pm go to [email or web address] - Social research officer, government  [facility] [place] [facility]  Office  [phone]  CC: Primary care physician; Lynnea Ferrier, MD

## 2016-06-28 LAB — URINE CULTURE

## 2016-07-01 LAB — CULTURE, BLOOD (ROUTINE X 2)
CULTURE: NO GROWTH
CULTURE: NO GROWTH

## 2016-11-12 ENCOUNTER — Emergency Department
Admission: EM | Admit: 2016-11-12 | Discharge: 2016-11-12 | Disposition: A | Discharge status: Home / Self Care | Payer: BLUE CROSS/BLUE SHIELD | Attending: Emergency Medicine | Admitting: Emergency Medicine

## 2016-11-12 ENCOUNTER — Encounter: Payer: Self-pay | Admitting: Emergency Medicine

## 2016-11-12 ENCOUNTER — Emergency Department: Payer: BLUE CROSS/BLUE SHIELD

## 2016-11-12 DIAGNOSIS — N390 Urinary tract infection, site not specified: Secondary | ICD-10-CM | POA: Insufficient documentation

## 2016-11-12 DIAGNOSIS — R109 Unspecified abdominal pain: Secondary | ICD-10-CM

## 2016-11-12 DIAGNOSIS — Z79899 Other long term (current) drug therapy: Secondary | ICD-10-CM | POA: Insufficient documentation

## 2016-11-12 DIAGNOSIS — I10 Essential (primary) hypertension: Secondary | ICD-10-CM | POA: Insufficient documentation

## 2016-11-12 LAB — URINALYSIS, COMPLETE (UACMP) WITH MICROSCOPIC
Bacteria, UA: NONE SEEN
Bilirubin Urine: NEGATIVE
GLUCOSE, UA: NEGATIVE mg/dL
KETONES UR: 20 mg/dL — AB
Nitrite: NEGATIVE
PH: 7 (ref 5.0–8.0)
PROTEIN: 100 mg/dL — AB
Specific Gravity, Urine: 1.013 (ref 1.005–1.030)

## 2016-11-12 LAB — BASIC METABOLIC PANEL
Anion gap: 10 (ref 5–15)
BUN: 18 mg/dL (ref 6–20)
CALCIUM: 9.5 mg/dL (ref 8.9–10.3)
CHLORIDE: 99 mmol/L — AB (ref 101–111)
CO2: 27 mmol/L (ref 22–32)
CREATININE: 0.71 mg/dL (ref 0.44–1.00)
GFR calc non Af Amer: >60 mL/min (ref >60)
Glucose, Bld: 122 mg/dL — ABNORMAL HIGH (ref 65–99)
Potassium: 3.5 mmol/L (ref 3.5–5.1)
Sodium: 136 mmol/L (ref 135–145)

## 2016-11-12 LAB — CBC
HEMATOCRIT: 47.7 % — AB (ref 35.0–47.0)
Hemoglobin: 16.1 g/dL — ABNORMAL HIGH (ref 12.0–16.0)
MCH: 31.9 pg (ref 26.0–34.0)
MCHC: 33.8 g/dL (ref 32.0–36.0)
MCV: 94.4 fL (ref 80.0–100.0)
Platelets: 285 10*3/uL (ref 150–440)
RBC: 5.06 MIL/uL (ref 3.80–5.20)
RDW: 12.8 % (ref 11.5–14.5)
WBC: 13.5 10*3/uL — ABNORMAL HIGH (ref 3.6–11.0)

## 2016-11-12 LAB — POCT PREGNANCY, URINE: Preg Test, Ur: NEGATIVE

## 2016-11-12 MED ORDER — CEPHALEXIN 500 MG PO CAPS
500.0000 mg | ORAL_CAPSULE | Freq: Once | ORAL | Status: AC
  Administered 2016-11-12: 500 mg via ORAL
  Filled 2016-11-12 (×2): qty 1

## 2016-11-12 MED ORDER — SULFAMETHOXAZOLE-TRIMETHOPRIM 800-160 MG PO TABS: 1.0000 | ORAL_TABLET | Freq: Two times a day (BID) | ORAL | 0 refills | Status: AC

## 2016-11-12 MED ORDER — ONDANSETRON 4 MG PO TBDP: 4.0000 mg | ORAL_TABLET | Freq: Three times a day (TID) | ORAL | 0 refills | Status: AC | PRN

## 2016-11-12 MED ORDER — OXYCODONE-ACETAMINOPHEN 5-325 MG PO TABS
ORAL_TABLET | ORAL | Status: DC
Start: 2016-11-12 — End: 2016-11-12
  Filled 2016-11-12: qty 1

## 2016-11-12 MED ORDER — OXYCODONE-ACETAMINOPHEN 5-325 MG PO TABS
1.0000 | ORAL_TABLET | Freq: Once | ORAL | Status: AC
  Administered 2016-11-12: 1 via ORAL

## 2016-11-12 MED ORDER — NAPROXEN 500 MG PO TABS: 500.0000 mg | ORAL_TABLET | Freq: Two times a day (BID) | ORAL | 0 refills | Status: AC

## 2016-11-12 NOTE — ED Triage Notes (Signed)
Pt presents with flank pain x 4-5 days; has worsened since yesterday. Has hx of kidney infections, states this is worse - that she has sharp, stabbing pains intermittently with dull pain constantly.  Pt concerned for kidney stones, no hx of same. Pt alert &  Oriented with obvious pain.

## 2016-11-12 NOTE — ED Provider Notes (Signed)
[facility] Emergency Department Provider Note  ____________________________________________  Time seen: Approximately 4:06 PM  I have reviewed the triage vital signs and the nursing notes.   HISTORY  Chief Complaint Flank Pain    HPI Julia Hartman is a 50 y.o. female who complains of flank pain for the past 4 or 5 days. Has a history of urinary tract infection and kidney infection in the past and this feels similar. It's also been accentuated with colicky sharp right flank pain that is sudden and severe when it occurs and then goes away after a few minutes. Denies urinary discomfort. No fever or chills chest pain shortness of breath vomiting or difficulty eating.     Past Medical History:  Diagnosis Date  . Hypertension      Patient Active Problem List   Diagnosis Date Noted  . Pyelonephritis 06/26/2016  . Avitaminosis D 09/17/2015  . Menopausal and perimenopausal disorder 09/17/2015  . Edema leg 09/17/2015  . BP (high blood pressure) 09/17/2015  . Blood glucose elevated 09/17/2015  . Cephalalgia 09/17/2015  . Anxiety 09/17/2015  . Biliary colic   . Gall stones   . Acute cholecystitis 09/10/2015     Past Surgical History:  Procedure Laterality Date  . CHOLECYSTECTOMY N/A 09/11/2015   Procedure: LAPAROSCOPIC CHOLECYSTECTOMY CONVERTED TO OPEN CHOLECYSTECTOMY;  Surgeon: Natale LayMark Bird, MD;  Location: [facility] ORS;  Service: General;  Laterality: N/A;     Prior to Admission medications   Medication Sig Start Date End Date Taking? Authorizing Provider  cefUROXime (CEFTIN) 500 MG tablet Take 1 tablet (500 mg total) by mouth 2 (two) times daily with a meal. 06/27/16   Adrian SaranSital Mody, MD  ibuprofen (ADVIL,MOTRIN) 200 MG tablet Take 400 mg by mouth every 6 (six) hours as needed for headache or mild pain.    Historical Provider, MD  naproxen (NAPROSYN) 500 MG tablet Take 1 tablet (500 mg total) by mouth 2 (two) times daily with a meal. 11/12/16   Sharman CheekPhillip  Blane Worthington, MD  Nutritional Supplements (ESTROVEN PO) Take 1 tablet by mouth daily.    Historical Provider, MD  ondansetron (ZOFRAN ODT) 4 MG disintegrating tablet Take 1 tablet (4 mg total) by mouth every 8 (eight) hours as needed for nausea or vomiting. 11/12/16   Sharman CheekPhillip Korbin Notaro, MD  sulfamethoxazole-trimethoprim (BACTRIM DS) 800-160 MG tablet Take 1 tablet by mouth 2 (two) times daily. 11/12/16   Sharman CheekPhillip Clarise Chacko, MD  triamterene-hydrochlorothiazide (MAXZIDE-25) 37.5-25 MG tablet Take 1 tablet by mouth daily.    Historical Provider, MD     Allergies Patient has no known allergies.   Family History  Problem Relation Age of Onset  . Hypertension Mother   . Diabetes Mother   . Hypertension Brother   . Hypertension Maternal Grandfather   . Diabetes Father   . Hypertension Father     Social History Social History  Substance Use Topics  . Smoking status: Never Smoker  . Smokeless tobacco: Never Used  . Alcohol use Yes     Comment: occasional    Review of Systems  Constitutional:   No fever or chills.  ENT:   No sore throat. No rhinorrhea. Cardiovascular:   No chest pain. Respiratory:   No dyspnea or cough. Gastrointestinal:   Positive right flank pain.  Genitourinary:   Negative for dysuria or difficulty urinating. Musculoskeletal:   Negative for focal pain or swelling Neurological:   Negative for headaches 10-point ROS otherwise negative.  ____________________________________________   PHYSICAL EXAM:  VITAL SIGNS:  ED Triage Vitals  Enc Vitals Group     BP 11/12/16 1218 121/85     Pulse Rate 11/12/16 1218 77     Resp 11/12/16 1218 20     Temp 11/12/16 1218 98.7 F (37.1 C)     Temp Source 11/12/16 1218 Oral     SpO2 11/12/16 1218 100 %     Weight 11/12/16 1218 170 lb (77.1 kg)     Height 11/12/16 1218 5\' 5"  (1.651 m)     Head Circumference --      Peak Flow --      Pain Score 11/12/16 1219 8     Pain Loc --      Pain Edu? --      Excl. in GC? --      Vital signs reviewed, nursing assessments reviewed.   Constitutional:   Alert and oriented. Well appearing and in no distress. Eyes:   No scleral icterus. No conjunctival pallor. PERRL. EOMI.  No nystagmus. ENT   Head:   Normocephalic and atraumatic.   Nose:   No congestion/rhinnorhea. No septal hematoma   Mouth/Throat:   MMM, no pharyngeal erythema. No peritonsillar mass.    Neck:   No stridor. No SubQ emphysema. No meningismus. Hematological/Lymphatic/Immunilogical:   No cervical lymphadenopathy. Cardiovascular:   RRR. Symmetric bilateral radial and DP pulses.  No murmurs.  Respiratory:   Normal respiratory effort without tachypnea nor retractions. Breath sounds are clear and equal bilaterally. No wheezes/rales/rhonchi. Gastrointestinal:   Soft with suprapubic tenderness. Non distended. There is no CVA tenderness.  No rebound, rigidity, or guarding. Genitourinary:   deferred Musculoskeletal:   Nontender with normal range of motion in all extremities. No joint effusions.  No lower extremity tenderness.  No edema. Neurologic:   Normal speech and language.  CN 2-10 normal. Motor grossly intact. No gross focal neurologic deficits are appreciated.  Skin:    Skin is warm, dry and intact. No rash noted.  No petechiae, purpura, or bullae.  ____________________________________________    LABS (pertinent positives/negatives) (all labs ordered are listed, but only abnormal results are displayed) Labs Reviewed  URINALYSIS, COMPLETE (UACMP) WITH MICROSCOPIC - Abnormal; Notable for the following:       Result Value   Color, Urine YELLOW (*)    APPearance CLOUDY (*)    Hgb urine dipstick LARGE (*)    Ketones, ur 20 (*)    Protein, ur 100 (*)    Leukocytes, UA LARGE (*)    Squamous Epithelial / LPF 0-5 (*)    All other components within normal limits  CBC - Abnormal; Notable for the following:    WBC 13.5 (*)    Hemoglobin 16.1 (*)    HCT 47.7 (*)    All other  components within normal limits  BASIC METABOLIC PANEL - Abnormal; Notable for the following:    Chloride 99 (*)    Glucose, Bld 122 (*)    All other components within normal limits  POCT PREGNANCY, URINE   ____________________________________________   EKG    ____________________________________________    RADIOLOGY  CT renal stone study unremarkable. Show some inflammation of the ureter. No kidney stone or urinary system obstruction  ____________________________________________   PROCEDURES Procedures  ____________________________________________   INITIAL IMPRESSION / ASSESSMENT AND PLAN / ED COURSE  Pertinent labs & imaging results that were available during my care of the patient were reviewed by me and considered in my medical decision making (see chart for details).  Patient presents with  right flank pain, exam suggestive of cystitis, labs consistent with urinary tract infection and cystitis. Vital signs normal. No pyelonephritis, no sepsis. We'll put the patient on Bactrim, treat symptomatically with naproxen and Zofran. Follow up with primary care. Considering the patient's symptoms, medical history, and physical examination today, I have low suspicion for cholecystitis or biliary pathology, pancreatitis, perforation or bowel obstruction, hernia, intra-abdominal abscess, AAA or dissection, volvulus or intussusception, mesenteric ischemia, or appendicitis.      Clinical Course    ____________________________________________   FINAL CLINICAL IMPRESSION(S) / ED DIAGNOSES  Final diagnoses:  Right flank pain  Lower urinary tract infectious disease      New Prescriptions   NAPROXEN (NAPROSYN) 500 MG TABLET    Take 1 tablet (500 mg total) by mouth 2 (two) times daily with a meal.   ONDANSETRON (ZOFRAN ODT) 4 MG DISINTEGRATING TABLET    Take 1 tablet (4 mg total) by mouth every 8 (eight) hours as needed for nausea or vomiting.   SULFAMETHOXAZOLE-TRIMETHOPRIM  (BACTRIM DS) 800-160 MG TABLET    Take 1 tablet by mouth 2 (two) times daily.     Portions of this note were generated with dragon dictation software. Dictation errors may occur despite best attempts at proofreading.    Sharman Cheek, MD 11/12/16 [phone]

## 2016-12-09 IMAGING — CT CT ABD-PELV W/ CM
2 of 5 series · 16 of 46 positions shown, 18 images · IV contrast (omnipaque)
Comparison: None.

CLINICAL DATA: Epigastric pain, vomiting starting [REDACTED] night

EXAM:
CT ABDOMEN AND PELVIS WITH CONTRAST
TECHNIQUE: Multidetector CT imaging of the abdomen and pelvis was performed
using the standard protocol following bolus administration of
intravenous contrast.
CONTRAST:  100mL OMNIPAQUE IOHEXOL 300 MG/ML  SOLN

[Series 2: routine abd pel with · axial · 0.79mm/px · z∈[-1100,-700]mm · 13 of 91 slices shown, 15 images]
[im 6/91  soft-tissue]
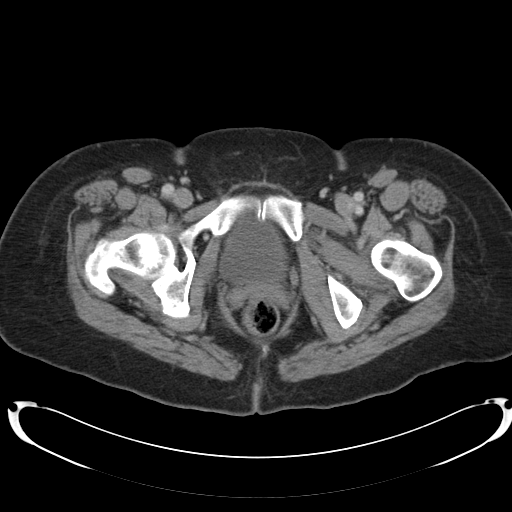
[im 6/91  bone]
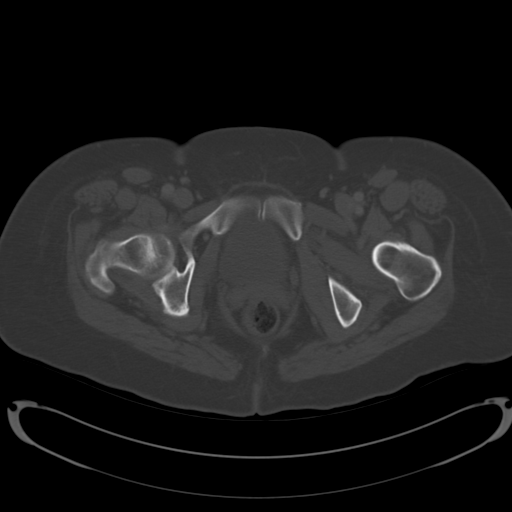
[im 11/91  soft-tissue]
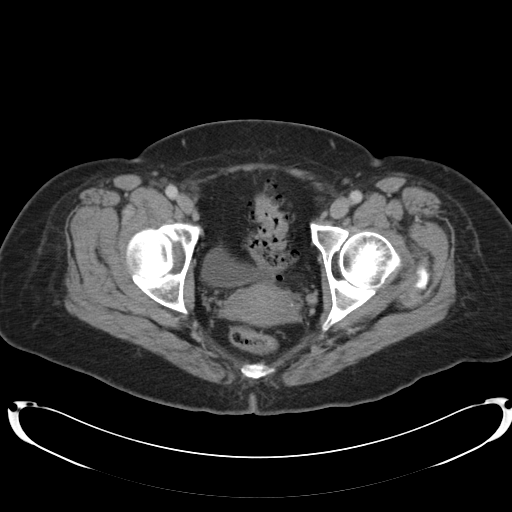
[im 21/91  soft-tissue]
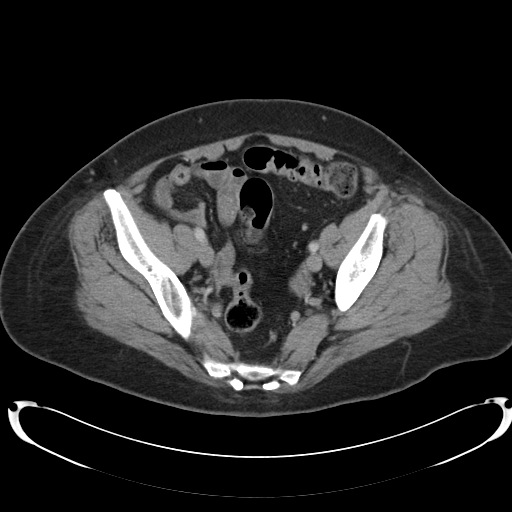
[im 26/91  soft-tissue]
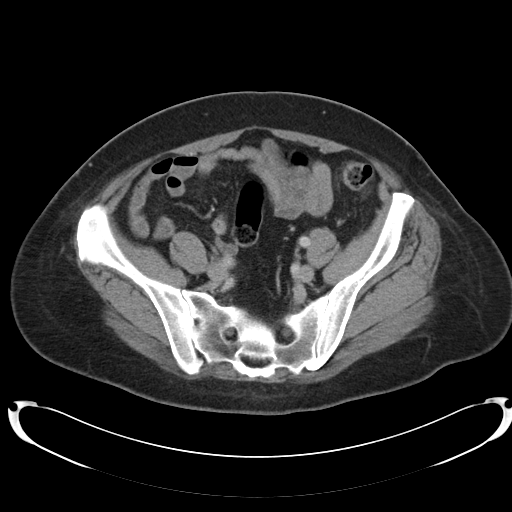
[im 31/91  soft-tissue]
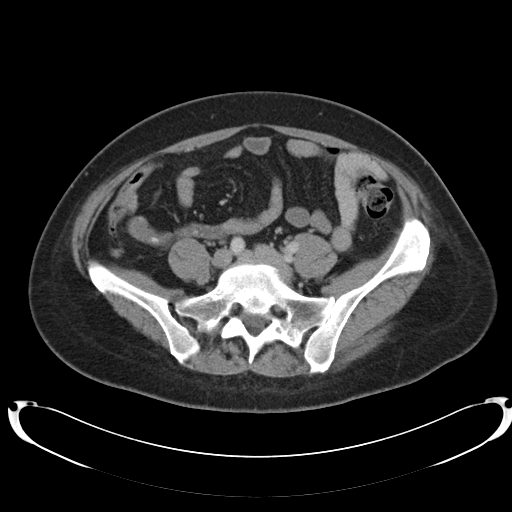
[im 41/91  soft-tissue]
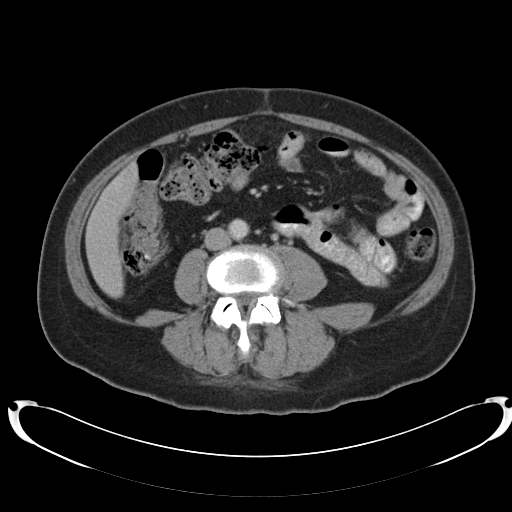
[im 46/91  soft-tissue]
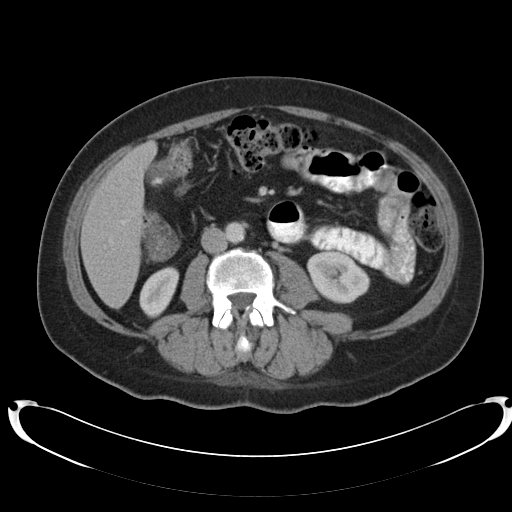
[im 51/91  soft-tissue]
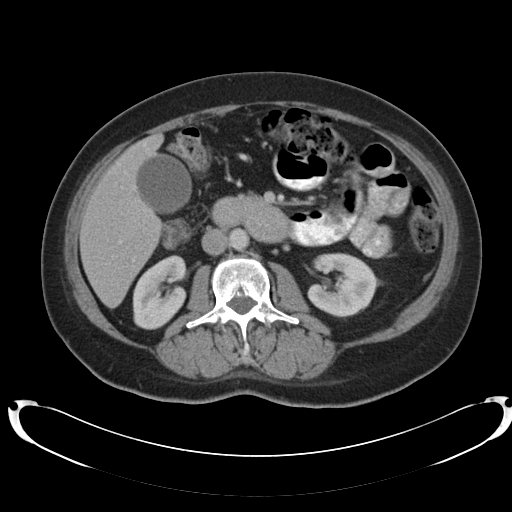
[im 61/91  soft-tissue]
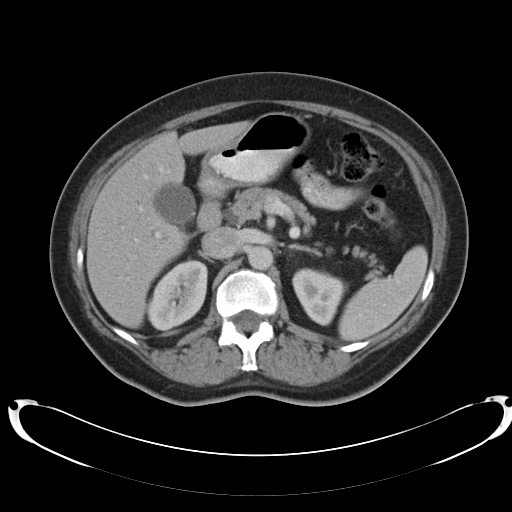
[im 61/91  bone]
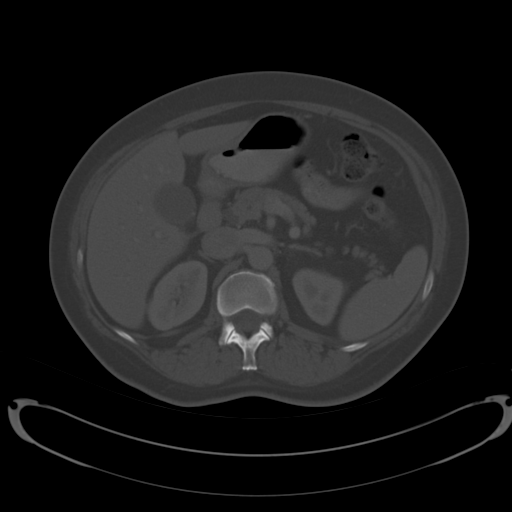
[im 66/91  soft-tissue]
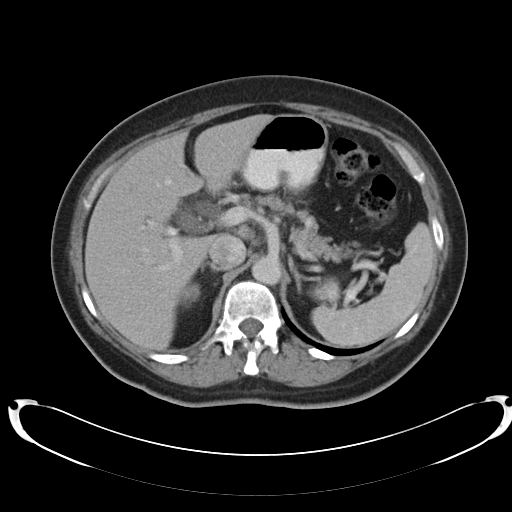
[im 71/91  soft-tissue]
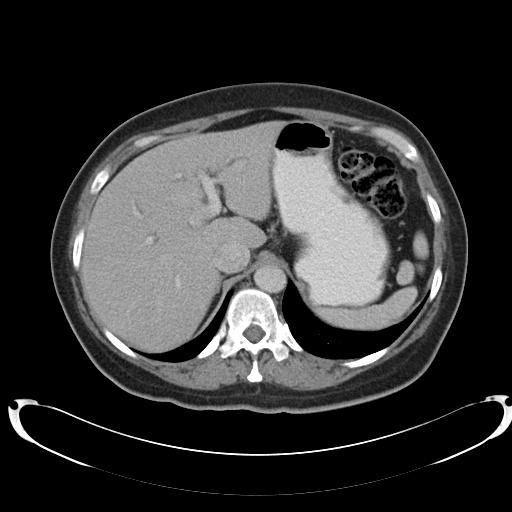
[im 81/91  soft-tissue]
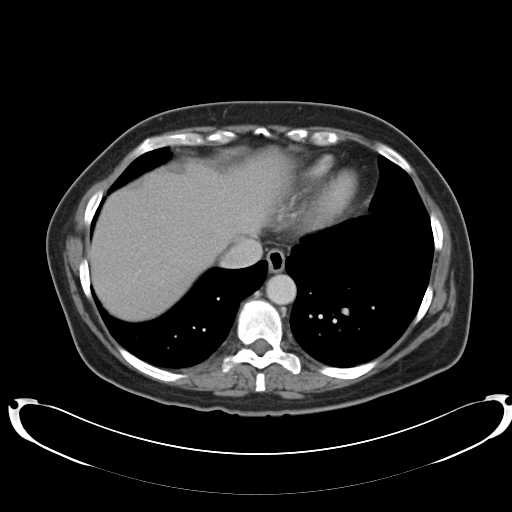
[im 86/91  soft-tissue]
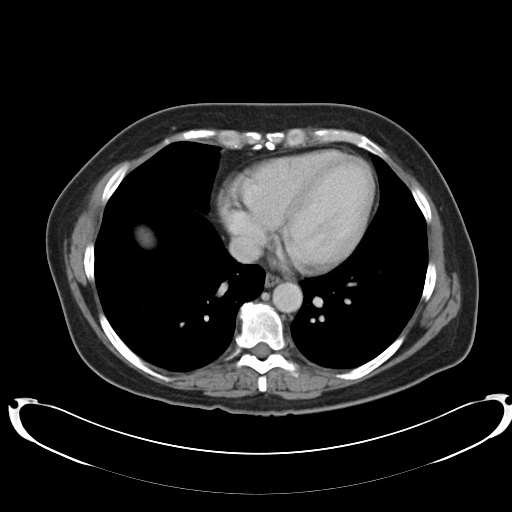

[Series 5: cor routine abd pel with · coronal · 0.74mm/px · 3 of 159 slices shown]
[im 53/159  soft-tissue]
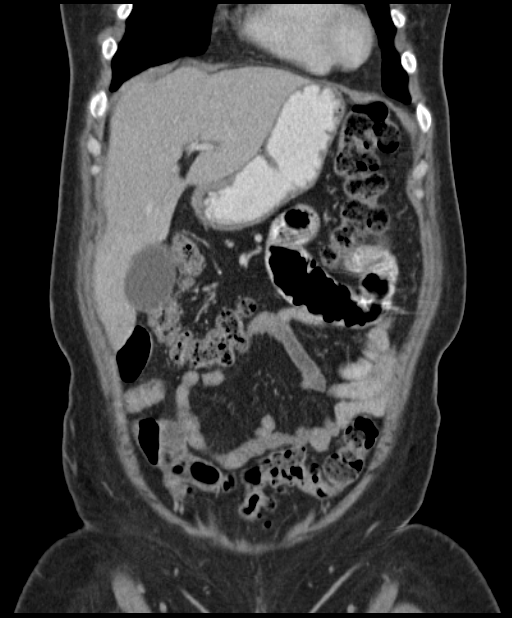
[im 71/159  soft-tissue]
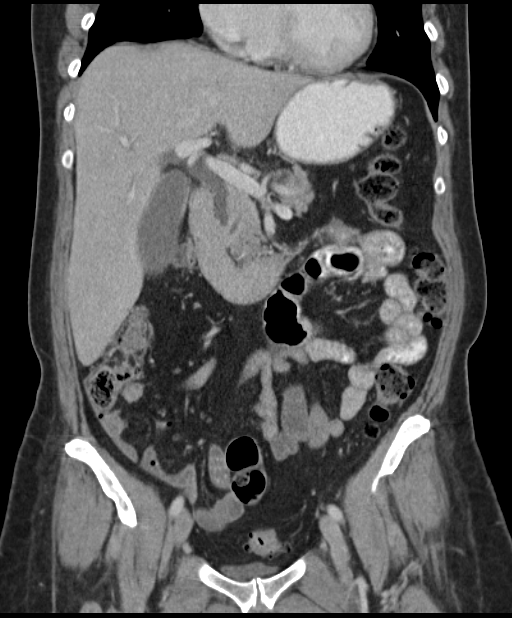
[im 88/159  soft-tissue]
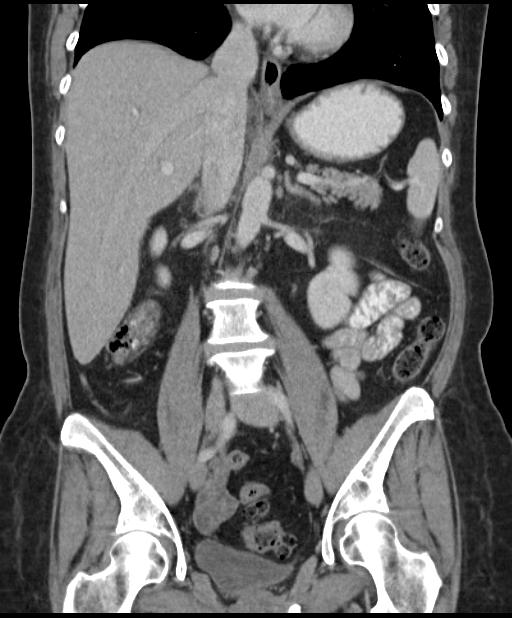

[16 of 46 positions shown; findings below may reference images not displayed]

FINDINGS: Lung bases are unremarkable. Enhanced liver shows no focal mass.
Sagittal images of the spine shows mild degenerative changes
thoracolumbar spine. Mild dextroscoliosis lumbar spine. Mild
distended gallbladder. Small calcified gallstones are noted in
gallbladder fundus the largest measures 5 mm. There is mild
thickening of the gallbladder wall in proximal gallbladder. This is
best visualized in coronal image 72. Findings are highly suspicious
for acute cholecystitis. Clinical correlation is necessary. CBD
measures 8 mm in diameter. Main pancreatic duct measures 3 mm in
diameter. No pancreatic mass. No evidence of acute pancreatitis. The
spleen and adrenal glands are unremarkable. Kidneys are symmetrical
in size and enhancement. No hydronephrosis or hydroureter.

No small bowel obstruction. No ascites or free air. No adenopathy.
Normal appendix. No pericecal inflammation. The terminal ileum is
unremarkable. The uterus is retroflexed. The ovaries are
unremarkable. Small amount of pelvic free fluid posterior pelvis. No
inguinal adenopathy. Mild degenerative changes pubic symphysis. No
destructive bony lesions are noted within pelvis. Mild degenerative
changes SI joints.

There is no evidence of gastric outlet obstruction.
IMPRESSION: 1. Small gallstones are noted within gallbladder. Mild thickening of
gallbladder wall and proximal gallbladder please see 72.
Cholecystitis cannot be excluded. Further evaluation some with
gallbladder ultrasound and clinical correlation is recommended.
2. Borderline prominent main pancreatic duct without evidence of
acute pancreatitis or pancreatic mass.
3. No hydronephrosis or hydroureter.
4. Normal appendix.  No pericecal inflammation.
5. Retroflexed uterus.  Small amount of free fluid within pelvis.
These results were called by telephone at the time of interpretation
on 09/10/2015 at [DATE] to Dr. ESLY MOO , who verbally
acknowledged these results.

## 2017-01-05 ENCOUNTER — Other Ambulatory Visit: Payer: Self-pay | Admitting: Internal Medicine

## 2017-01-05 DIAGNOSIS — Z1231 Encounter for screening mammogram for malignant neoplasm of breast: Secondary | ICD-10-CM

## 2017-01-26 ENCOUNTER — Ambulatory Visit
Admission: RE | Admit: 2017-01-26 | Discharge: 2017-01-26 | Disposition: A | Discharge status: Home / Self Care | Payer: BLUE CROSS/BLUE SHIELD | Source: Ambulatory Visit | Attending: Internal Medicine | Admitting: Internal Medicine

## 2017-01-26 DIAGNOSIS — Z1231 Encounter for screening mammogram for malignant neoplasm of breast: Secondary | ICD-10-CM | POA: Insufficient documentation

## 2023-12-09 ENCOUNTER — Other Ambulatory Visit: Payer: Self-pay | Admitting: Internal Medicine

## 2023-12-09 DIAGNOSIS — R1032 Left lower quadrant pain: Secondary | ICD-10-CM

## 2023-12-11 ENCOUNTER — Inpatient Hospital Stay: Admission: RE | Admit: 2023-12-11 | Payer: BLUE CROSS/BLUE SHIELD | Source: Ambulatory Visit

## 2023-12-14 ENCOUNTER — Other Ambulatory Visit: Payer: BLUE CROSS/BLUE SHIELD

## 2023-12-15 ENCOUNTER — Ambulatory Visit
Admission: RE | Admit: 2023-12-15 | Discharge: 2023-12-15 | Discharge status: Home / Self Care | Payer: BLUE CROSS/BLUE SHIELD | Source: Ambulatory Visit | Attending: Internal Medicine | Admitting: Internal Medicine

## 2023-12-15 DIAGNOSIS — R1032 Left lower quadrant pain: Secondary | ICD-10-CM

## 2023-12-15 MED ORDER — IOPAMIDOL (ISOVUE-300) INJECTION 61%
100.0000 mL | Freq: Once | INTRAVENOUS | Status: AC | PRN
  Administered 2023-12-15: 100 mL via INTRAVENOUS

## 2024-08-02 ENCOUNTER — Encounter: Payer: Self-pay | Admitting: Internal Medicine

## 2024-08-09 ENCOUNTER — Ambulatory Visit: Admission: RE | Admit: 2024-08-09 | Source: Home / Self Care | Admitting: Internal Medicine

## 2024-08-09 HISTORY — DX: Atherosclerotic heart disease of native coronary artery without angina pectoris: I25.10

## 2024-08-09 HISTORY — DX: Vitamin D deficiency, unspecified: E55.9

## 2024-08-09 HISTORY — DX: Prediabetes: R73.03

## 2024-08-09 HISTORY — DX: Polyp of corpus uteri: N84.0

## 2024-08-09 HISTORY — DX: Personal history of other malignant neoplasm of skin: Z85.828

## 2024-08-09 HISTORY — DX: Disorder of adrenal gland, unspecified: E27.9

## 2024-08-09 HISTORY — DX: Hyperlipidemia, unspecified: E78.5

## 2024-08-09 SURGERY — COLONOSCOPY
Anesthesia: General
